# Patient Record
Sex: Female | Born: 1970 | Race: Black or African American | Hispanic: No | Marital: Single | Smoking: Former smoker
Health system: Southern US, Community
[De-identification: ages and names within clinical notes are randomized; demographics above are authoritative.]

## PROBLEM LIST (undated history)

## (undated) DIAGNOSIS — F419 Anxiety disorder, unspecified: Secondary | ICD-10-CM

## (undated) DIAGNOSIS — Z87891 Personal history of nicotine dependence: Secondary | ICD-10-CM

## (undated) DIAGNOSIS — J189 Pneumonia, unspecified organism: Secondary | ICD-10-CM

## (undated) DIAGNOSIS — Z9889 Other specified postprocedural states: Secondary | ICD-10-CM

## (undated) DIAGNOSIS — D649 Anemia, unspecified: Secondary | ICD-10-CM

## (undated) DIAGNOSIS — F32A Depression, unspecified: Secondary | ICD-10-CM

## (undated) DIAGNOSIS — C801 Malignant (primary) neoplasm, unspecified: Secondary | ICD-10-CM

## (undated) HISTORY — PX: THYROIDECTOMY: SHX17

## (undated) HISTORY — DX: Personal history of nicotine dependence: Z87.891

## (undated) HISTORY — PX: PARTIAL HYSTERECTOMY: SHX80

---

## 2005-03-26 HISTORY — PX: CAROTID ENDARTERECTOMY: SUR193

## 2021-02-15 ENCOUNTER — Other Ambulatory Visit: Payer: Self-pay

## 2021-02-15 ENCOUNTER — Ambulatory Visit: Payer: BC Managed Care – PPO | Admitting: Pulmonary Disease

## 2021-02-15 ENCOUNTER — Encounter: Payer: Self-pay | Admitting: Pulmonary Disease

## 2021-02-15 VITALS — BP 122/76 | HR 87 | Temp 98.1°F | Ht 63.0 in | Wt 182.8 lb

## 2021-02-15 DIAGNOSIS — R911 Solitary pulmonary nodule: Secondary | ICD-10-CM | POA: Diagnosis not present

## 2021-02-15 DIAGNOSIS — Z87891 Personal history of nicotine dependence: Secondary | ICD-10-CM | POA: Diagnosis not present

## 2021-02-15 DIAGNOSIS — J432 Centrilobular emphysema: Secondary | ICD-10-CM | POA: Diagnosis not present

## 2021-02-15 MED ORDER — SPIRIVA RESPIMAT 1.25 MCG/ACT IN AERS: 2.0000 | INHALATION_SPRAY | Freq: Every day | RESPIRATORY_TRACT | 0 refills | Status: AC

## 2021-02-15 MED ORDER — ALBUTEROL SULFATE HFA 108 (90 BASE) MCG/ACT IN AERS: 2.0000 | INHALATION_SPRAY | Freq: Four times a day (QID) | RESPIRATORY_TRACT | 6 refills | Status: AC | PRN

## 2021-02-15 NOTE — Progress Notes (Signed)
Synopsis: Referred in November for lung mass by No ref. provider found  Subjective:   PATIENT ID: Brittany Macias GENDER: female DOB: [birth year], MRN: [id number]  Chief Complaint  Patient presents with   Consult    Patient has a lung mass    PMH former smoker, presents today from [facility].  Patient had a lung cancer screening CT that was done on 02/13/2021.  She just quit smoking this past week.  She smokes since she was a early teen.  She does have some evidence of emphysema on her CT scan.  She has a new right lower lobe 2.3 cm nodule with spiculated margins.  Just distal to that there is a larger 3 cm consolidation that is triangular in shape representing possibly in a separate mass versus atelectasis.   Past Medical History:  Diagnosis Date   Former smoker      Family History  Problem Relation Age of Onset   Gastric cancer Mother    Lymphoma Sister      Past Surgical History:  Procedure Laterality Date   THYROIDECTOMY      Social History   Socioeconomic History   Marital status: Single    Spouse name: Not on file   Number of children: Not on file   Years of education: Not on file   Highest education level: Not on file  Occupational History   Not on file  Tobacco Use   Smoking status: Former    Types: Cigarettes   Smokeless tobacco: Never  Substance and Sexual Activity   Alcohol use: Not on file   Drug use: Not on file   Sexual activity: Not on file  Other Topics Concern   Not on file  Social History Narrative   Not on file   Social Determinants of Health   Financial Resource Strain: Not on file  Food Insecurity: Not on file  Transportation Needs: Not on file  Physical Activity: Not on file  Stress: Not on file  Social Connections: Not on file  Intimate Partner Violence: Not on file     Allergies  Allergen Reactions   Erythromycin Anaphylaxis   Sulfabenzamide Swelling     Outpatient Medications Prior to Visit  Medication Sig  Dispense Refill   levothyroxine (SYNTHROID) 100 MCG tablet Take 100 mcg by mouth daily.     No facility-administered medications prior to visit.    Review of Systems  Constitutional:  Negative for chills, fever, malaise/fatigue and weight loss.  HENT:  Negative for hearing loss, sore throat and tinnitus.   Eyes:  Negative for blurred vision and double vision.  Respiratory:  Positive for cough. Negative for hemoptysis, sputum production, shortness of breath, wheezing and stridor.   Cardiovascular:  Negative for chest pain, palpitations, orthopnea, leg swelling and PND.  Gastrointestinal:  Negative for abdominal pain, constipation, diarrhea, heartburn, nausea and vomiting.  Genitourinary:  Negative for dysuria, hematuria and urgency.  Musculoskeletal:  Negative for joint pain and myalgias.  Skin:  Negative for itching and rash.  Neurological:  Negative for dizziness, tingling, weakness and headaches.  Endo/Heme/Allergies:  Negative for environmental allergies. Does not bruise/bleed easily.  Psychiatric/Behavioral:  Negative for depression. The patient is not nervous/anxious and does not have insomnia.   All other systems reviewed and are negative.   Objective:  Physical Exam Vitals reviewed.  Constitutional:      General: She is not in acute distress.    Appearance: She is well-developed.  HENT:  Head: Normocephalic and atraumatic.  Eyes:     General: No scleral icterus.    Conjunctiva/sclera: Conjunctivae normal.     Pupils: Pupils are equal, round, and reactive to light.  Neck:     Vascular: No JVD.     Trachea: No tracheal deviation.  Cardiovascular:     Rate and Rhythm: Normal rate and regular rhythm.     Heart sounds: Normal heart sounds. No murmur heard. Pulmonary:     Effort: Pulmonary effort is normal. No tachypnea, accessory muscle usage or respiratory distress.     Breath sounds: Normal breath sounds. No stridor. No wheezing, rhonchi or rales.  Abdominal:      General: Bowel sounds are normal. There is no distension.     Palpations: Abdomen is soft.     Tenderness: There is no abdominal tenderness.  Musculoskeletal:        General: No tenderness.     Cervical back: Neck supple.  Lymphadenopathy:     Cervical: No cervical adenopathy.  Skin:    General: Skin is warm and dry.     Capillary Refill: Capillary refill takes less than 2 seconds.     Findings: No rash.  Neurological:     Mental Status: She is alert and oriented to person, place, and time.  Psychiatric:        Behavior: Behavior normal.     Vitals:   02/15/21 0859  BP: 122/76  Pulse: 87  Temp: 98.1 F (36.7 C)  TempSrc: Oral  SpO2: 99%  Weight: 182 lb 12.8 oz (82.9 kg)  Height: 5\' 3"  (1.6 m)   99% on RA BMI Readings from Last 3 Encounters:  02/15/21 32.38 kg/m   Wt Readings from Last 3 Encounters:  02/15/21 182 lb 12.8 oz (82.9 kg)     CBC No results found for: WBC, RBC, HGB, HCT, PLT, MCV, MCH, MCHC, RDW, LYMPHSABS, MONOABS, EOSABS, BASOSABS    Chest Imaging: CT imaging November 2022 lung cancer screening: This was completed at [facility] health.  Disc was brought to the office today which we reviewed. 2.3 cm right lower lobe spiculated mass with an associated more distal pleural-based 3 cm lesion which looks more like atelectasis. The patient's images have been independently reviewed by me.    Pulmonary Functions Testing Results: No flowsheet data found.  FeNO:   Pathology:   Echocardiogram:   Heart Catheterization:     Assessment & Plan:     ICD-10-CM   1. Lung nodule  R91.1 NM PET Image Initial (PI) Skull Base To Thigh (F-18 FDG)    Pulmonary Function Test    2. Centrilobular emphysema ([place])  J43.2     3. Former smoker  Z87.891       Discussion:  This is a 50 year old female, right lower lobe pulmonary nodule, former smoker, abnormal lung cancer screening CT referred for evaluation of lung nodule.  Plan: Patient has a lesion which is  concerning for malignancy. I think next best step is to evaluate both of the lesions present with a nuclear medicine PET scan. Pending upon the PET if we need to proceed with biopsy versus consideration for direct resection. We will get PFTs as well in this process. Patient return to see [place] in approximately 3 weeks after all of these tests are complete. We will come up with next best steps. I am hopeful that she has stage I disease and will be able to consider resection.    Current Outpatient Medications:  albuterol (VENTOLIN HFA) 108 (90 Base) MCG/ACT inhaler, Inhale 2 puffs into the lungs every 6 (six) hours as needed for wheezing or shortness of breath., Disp: 8 g, Rfl: 6   levothyroxine (SYNTHROID) 100 MCG tablet, Take 100 mcg by mouth daily., Disp: , Rfl:   I spent 62 minutes dedicated to the care of this patient on the date of this encounter to include pre-visit review of records, face-to-face time with the patient discussing conditions above, post visit ordering of testing, clinical documentation with the electronic health record, making appropriate referrals as documented, and communicating necessary findings to members of the patients care team.   Garner Nash, DO [facility] 02/15/2021 9:35 AM

## 2021-02-15 NOTE — Patient Instructions (Addendum)
Thank you for visiting Dr. Valeta Harms at [facility]. Today we recommend the following:  Orders Placed This Encounter  Procedures   NM PET Image Initial (PI) Skull Base To Thigh (F-18 FDG)   Pulmonary Function Test   Please see me after the PET scan and PFTs are complete.   Return in about 3 weeks (around 03/08/2021) for w/Dr. Valeta Harms .    Please do your part to reduce the spread of COVID-19.

## 2021-02-23 ENCOUNTER — Encounter (HOSPITAL_COMMUNITY)
Admission: RE | Admit: 2021-02-23 | Discharge: 2021-02-23 | Disposition: A | Discharge status: Home / Self Care | Payer: BC Managed Care – PPO | Source: Ambulatory Visit | Attending: Pulmonary Disease | Admitting: Pulmonary Disease

## 2021-02-23 ENCOUNTER — Ambulatory Visit (HOSPITAL_COMMUNITY)
Admission: RE | Admit: 2021-02-23 | Discharge: 2021-02-23 | Disposition: A | Discharge status: Home / Self Care | Payer: BC Managed Care – PPO | Source: Ambulatory Visit | Attending: Pulmonary Disease | Admitting: Pulmonary Disease

## 2021-02-23 ENCOUNTER — Other Ambulatory Visit: Payer: Self-pay

## 2021-02-23 DIAGNOSIS — R911 Solitary pulmonary nodule: Secondary | ICD-10-CM | POA: Insufficient documentation

## 2021-02-23 LAB — PULMONARY FUNCTION TEST
DL/VA % pred: 102 %
DL/VA: 4.41 ml/min/mmHg/L
DLCO unc % pred: 67 %
DLCO unc: 13.72 ml/min/mmHg
FEF 25-75 Post: 3 L/sec
FEF 25-75 Pre: 2.96 L/sec
FEF2575-%Change-Post: 1 %
FEF2575-%Pred-Post: 129 %
FEF2575-%Pred-Pre: 128 %
FEV1-%Change-Post: 0 %
FEV1-%Pred-Post: 122 %
FEV1-%Pred-Pre: 122 %
FEV1-Post: 2.69 L
FEV1-Pre: 2.68 L
FEV1FVC-%Change-Post: 2 %
FEV1FVC-%Pred-Pre: 101 %
FEV6-%Change-Post: -1 %
FEV6-%Pred-Post: 120 %
FEV6-%Pred-Pre: 122 %
FEV6-Post: 3.21 L
FEV6-Pre: 3.26 L
FEV6FVC-%Change-Post: 0 %
FEV6FVC-%Pred-Post: 103 %
FEV6FVC-%Pred-Pre: 103 %
FVC-%Change-Post: -1 %
FVC-%Pred-Post: 116 %
FVC-%Pred-Pre: 118 %
FVC-Post: 3.21 L
FVC-Pre: 3.26 L
Post FEV1/FVC ratio: 84 %
Post FEV6/FVC ratio: 100 %
Pre FEV1/FVC ratio: 82 %
Pre FEV6/FVC Ratio: 100 %
RV % pred: 102 %
RV: 1.83 L
TLC % pred: 107 %
TLC: 5.28 L

## 2021-02-23 MED ORDER — FLUDEOXYGLUCOSE F - 18 (FDG) INJECTION
9.0350 | Freq: Once | INTRAVENOUS | Status: AC | PRN
  Administered 2021-02-23: 9.035 via INTRAVENOUS

## 2021-02-23 MED ORDER — ALBUTEROL SULFATE (2.5 MG/3ML) 0.083% IN NEBU
2.5000 mg | INHALATION_SOLUTION | Freq: Once | RESPIRATORY_TRACT | Status: AC
  Administered 2021-02-23: 2.5 mg via RESPIRATORY_TRACT

## 2021-02-27 ENCOUNTER — Ambulatory Visit: Payer: BC Managed Care – PPO | Admitting: Pulmonary Disease

## 2021-02-27 ENCOUNTER — Encounter: Payer: Self-pay | Admitting: Pulmonary Disease

## 2021-02-27 ENCOUNTER — Other Ambulatory Visit: Payer: Self-pay

## 2021-02-27 VITALS — BP 136/88 | HR 88 | Temp 98.2°F | Ht 63.0 in | Wt 181.2 lb

## 2021-02-27 DIAGNOSIS — R911 Solitary pulmonary nodule: Secondary | ICD-10-CM

## 2021-02-27 DIAGNOSIS — Z87891 Personal history of nicotine dependence: Secondary | ICD-10-CM | POA: Diagnosis not present

## 2021-02-27 DIAGNOSIS — R942 Abnormal results of pulmonary function studies: Secondary | ICD-10-CM | POA: Diagnosis not present

## 2021-02-27 NOTE — Patient Instructions (Signed)
Thank you for visiting Dr. Valeta Harms at [facility]. Today we recommend the following:  Orders Placed This Encounter  Procedures   Ambulatory referral to Cardiothoracic Surgery   Appt pending with Dr. Kipp Brood   Return in about 3 months (around 05/28/2021) for w/ Dr. Valeta Harms .    Please do your part to reduce the spread of COVID-19.

## 2021-02-27 NOTE — Progress Notes (Signed)
Synopsis: Referred in November for lung mass by Marjo Bicker, MD  Subjective:   PATIENT ID: Brittany Macias GENDER: female DOB: Aug 14, 1970, MRN: [id number]  Chief Complaint  Patient presents with   Follow-up    Follow up on PFT and PET scan results    PMH former smoker, presents today from [facility].  Patient had a lung cancer screening CT that was done on 02/13/2021.  She just quit smoking this past week.  She smokes since she was a early teen.  She does have some evidence of emphysema on her CT scan.  She has a new right lower lobe 2.3 cm nodule with spiculated margins.  Just distal to that there is a larger 3 cm consolidation that is triangular in shape representing possibly in a separate mass versus atelectasis.  OV 02/27/2021: Here today for follow-up after PET scan imaging. Patient had PET scan completed on 02/23/2021.  PET scan reveals a hypermetabolic centralized lesion at 1.3 x 1.2 cm with an SUV max of 6.6.  There is a more distal pleural-based lesion with low-level metabolism.  Patient also had pulmonary function test completed.  Today in the office we reviewed PFTs and pulmonary function test and talked about next steps.   Past Medical History:  Diagnosis Date   Former smoker      Family History  Problem Relation Age of Onset   Gastric cancer Mother    Lymphoma Sister      Past Surgical History:  Procedure Laterality Date   THYROIDECTOMY      Social History   Socioeconomic History   Marital status: Single    Spouse name: Not on file   Number of children: Not on file   Years of education: Not on file   Highest education level: Not on file  Occupational History   Not on file  Tobacco Use   Smoking status: Former    Types: Cigarettes   Smokeless tobacco: Never  Substance and Sexual Activity   Alcohol use: Not on file   Drug use: Not on file   Sexual activity: Not on file  Other Topics Concern   Not on file  Social History Narrative   Not  on file   Social Determinants of Health   Financial Resource Strain: Not on file  Food Insecurity: Not on file  Transportation Needs: Not on file  Physical Activity: Not on file  Stress: Not on file  Social Connections: Not on file  Intimate Partner Violence: Not on file     Allergies  Allergen Reactions   Erythromycin Anaphylaxis   Sulfabenzamide Swelling     Outpatient Medications Prior to Visit  Medication Sig Dispense Refill   albuterol (VENTOLIN HFA) 108 (90 Base) MCG/ACT inhaler Inhale 2 puffs into the lungs every 6 (six) hours as needed for wheezing or shortness of breath. 8 g 6   levothyroxine (SYNTHROID) 100 MCG tablet Take 100 mcg by mouth daily.     Tiotropium Bromide Monohydrate (SPIRIVA RESPIMAT) 1.25 MCG/ACT AERS Inhale 2 puffs into the lungs daily. 4 g 0   No facility-administered medications prior to visit.    Review of Systems  Constitutional:  Negative for chills, fever, malaise/fatigue and weight loss.  HENT:  Negative for hearing loss, sore throat and tinnitus.   Eyes:  Negative for blurred vision and double vision.  Respiratory:  Negative for cough, hemoptysis, sputum production, shortness of breath, wheezing and stridor.   Cardiovascular:  Negative for chest pain, palpitations,  orthopnea, leg swelling and PND.  Gastrointestinal:  Negative for abdominal pain, constipation, diarrhea, heartburn, nausea and vomiting.  Genitourinary:  Negative for dysuria, hematuria and urgency.  Musculoskeletal:  Negative for joint pain and myalgias.  Skin:  Negative for itching and rash.  Neurological:  Negative for dizziness, tingling, weakness and headaches.  Endo/Heme/Allergies:  Negative for environmental allergies. Does not bruise/bleed easily.  Psychiatric/Behavioral:  Negative for depression. The patient is not nervous/anxious and does not have insomnia.   All other systems reviewed and are negative.   Objective:  Physical Exam Vitals reviewed.  Constitutional:       General: She is not in acute distress.    Appearance: She is well-developed.  HENT:     Head: Normocephalic and atraumatic.  Eyes:     General: No scleral icterus.    Conjunctiva/sclera: Conjunctivae normal.     Pupils: Pupils are equal, round, and reactive to light.  Neck:     Vascular: No JVD.     Trachea: No tracheal deviation.  Cardiovascular:     Rate and Rhythm: Normal rate and regular rhythm.     Heart sounds: Normal heart sounds. No murmur heard. Pulmonary:     Effort: Pulmonary effort is normal. No tachypnea, accessory muscle usage or respiratory distress.     Breath sounds: Normal breath sounds. No stridor. No wheezing, rhonchi or rales.  Abdominal:     General: Bowel sounds are normal. There is no distension.     Palpations: Abdomen is soft.     Tenderness: There is no abdominal tenderness.  Musculoskeletal:        General: No tenderness.     Cervical back: Neck supple.  Lymphadenopathy:     Cervical: No cervical adenopathy.  Skin:    General: Skin is warm and dry.     Capillary Refill: Capillary refill takes less than 2 seconds.     Findings: No rash.  Neurological:     Mental Status: She is alert and oriented to person, place, and time.  Psychiatric:        Behavior: Behavior normal.     Vitals:   02/27/21 0917  BP: 136/88  Pulse: 88  Temp: 98.2 F (36.8 C)  TempSrc: Oral  SpO2: 99%  Weight: 181 lb 3.2 oz (82.2 kg)  Height: 5\' 3"  (1.6 m)   99% on RA BMI Readings from Last 3 Encounters:  02/27/21 32.10 kg/m  02/15/21 32.38 kg/m   Wt Readings from Last 3 Encounters:  02/27/21 181 lb 3.2 oz (82.2 kg)  02/15/21 182 lb 12.8 oz (82.9 kg)     CBC No results found for: WBC, RBC, HGB, HCT, PLT, MCV, MCH, MCHC, RDW, LYMPHSABS, MONOABS, EOSABS, BASOSABS    Chest Imaging: CT imaging November 2022 lung cancer screening: This was completed at [facility] health.  Disc was brought to the office today which we reviewed. 2.3 cm right lower lobe  spiculated mass with an associated more distal pleural-based 3 cm lesion which looks more like atelectasis. The patient's images have been independently reviewed by me.    Nuclear medicine pet imaging 02/23/2021: Patient has a small focus 1.3 x 1.1 cm in the proximal portion of the right lower lobe centrally located hypermetabolic uptake with an SUV max of 6.6 concerning for primary bronchogenic carcinoma.   Pulmonary Functions Testing Results: PFT Results Latest Ref Rng & Units 02/23/2021  FVC-Pre L 3.26  FVC-Predicted Pre % 118  FVC-Post L 3.21  FVC-Predicted Post % 116  Pre FEV1/FVC % % 82  Post FEV1/FCV % % 84  FEV1-Pre L 2.68  FEV1-Predicted Pre % 122  FEV1-Post L 2.69  DLCO uncorrected ml/min/mmHg 13.72  DLCO UNC% % 67  DLVA Predicted % 102  TLC L 5.28  TLC % Predicted % 107  RV % Predicted % 102    FeNO:   Pathology:   Echocardiogram:   Heart Catheterization:     Assessment & Plan:     ICD-10-CM   1. Lung nodule  R91.1 Ambulatory referral to Cardiothoracic Surgery    2. Abnormal PET of right lung  R94.2     3. Former smoker  Z87.891        Discussion:  This is a 50 year old female, right lower lobe pulmonary nodule, former smoker, abnormal lung cancer screening CT, now with an abnormal PET scan with a centrally located 1.3 x 1.2 cm lesion.  She has what appears to me as a more centrally located nodule that has postobstructive changes afterwards that is causing atelectasis.  Plan: She is young with good pulmonary function test.  We reviewed this today in the office. We will send a referral to thoracic surgery for consideration of combination case versus resection. I think patient would be a good candidate for this as she is also a recent non-smoker. She is overall anxious regarding this and I explained that we would come up with a plan and determine next steps. If Dr. Kipp Brood thinks that we need to biopsy this we could always consider doing it first  however we can also plan potentially for combination single anesthetic event.  We appreciate the coordination of care.   Current Outpatient Medications:    albuterol (VENTOLIN HFA) 108 (90 Base) MCG/ACT inhaler, Inhale 2 puffs into the lungs every 6 (six) hours as needed for wheezing or shortness of breath., Disp: 8 g, Rfl: 6   levothyroxine (SYNTHROID) 100 MCG tablet, Take 100 mcg by mouth daily., Disp: , Rfl:    Tiotropium Bromide Monohydrate (SPIRIVA RESPIMAT) 1.25 MCG/ACT AERS, Inhale 2 puffs into the lungs daily., Disp: 4 g, Rfl: 0   Garner Nash, DO [facility] 02/27/2021 9:40 AM

## 2021-02-27 NOTE — H&P (View-Only) (Signed)
Synopsis: Referred in November for lung mass by Marjo Bicker, MD  Subjective:   PATIENT ID: Brittany Macias GENDER: female DOB: [birth year], MRN: [id number]  Chief Complaint  Patient presents with   Follow-up    Follow up on PFT and PET scan results    PMH former smoker, presents today from [facility].  Patient had a lung cancer screening CT that was done on 02/13/2021.  She just quit smoking this past week.  She smokes since she was a early teen.  She does have some evidence of emphysema on her CT scan.  She has a new right lower lobe 2.3 cm nodule with spiculated margins.  Just distal to that there is a larger 3 cm consolidation that is triangular in shape representing possibly in a separate mass versus atelectasis.  OV 02/27/2021: Here today for follow-up after PET scan imaging. Patient had PET scan completed on 02/23/2021.  PET scan reveals a hypermetabolic centralized lesion at 1.3 x 1.2 cm with an SUV max of 6.6.  There is a more distal pleural-based lesion with low-level metabolism.  Patient also had pulmonary function test completed.  Today in the office we reviewed PFTs and pulmonary function test and talked about next steps.   Past Medical History:  Diagnosis Date   Former smoker      Family History  Problem Relation Age of Onset   Gastric cancer Mother    Lymphoma Sister      Past Surgical History:  Procedure Laterality Date   THYROIDECTOMY      Social History   Socioeconomic History   Marital status: Single    Spouse name: Not on file   Number of children: Not on file   Years of education: Not on file   Highest education level: Not on file  Occupational History   Not on file  Tobacco Use   Smoking status: Former    Types: Cigarettes   Smokeless tobacco: Never  Substance and Sexual Activity   Alcohol use: Not on file   Drug use: Not on file   Sexual activity: Not on file  Other Topics Concern   Not on file  Social History Narrative   Not  on file   Social Determinants of Health   Financial Resource Strain: Not on file  Food Insecurity: Not on file  Transportation Needs: Not on file  Physical Activity: Not on file  Stress: Not on file  Social Connections: Not on file  Intimate Partner Violence: Not on file     Allergies  Allergen Reactions   Erythromycin Anaphylaxis   Sulfabenzamide Swelling     Outpatient Medications Prior to Visit  Medication Sig Dispense Refill   albuterol (VENTOLIN HFA) 108 (90 Base) MCG/ACT inhaler Inhale 2 puffs into the lungs every 6 (six) hours as needed for wheezing or shortness of breath. 8 g 6   levothyroxine (SYNTHROID) 100 MCG tablet Take 100 mcg by mouth daily.     Tiotropium Bromide Monohydrate (SPIRIVA RESPIMAT) 1.25 MCG/ACT AERS Inhale 2 puffs into the lungs daily. 4 g 0   No facility-administered medications prior to visit.    Review of Systems  Constitutional:  Negative for chills, fever, malaise/fatigue and weight loss.  HENT:  Negative for hearing loss, sore throat and tinnitus.   Eyes:  Negative for blurred vision and double vision.  Respiratory:  Negative for cough, hemoptysis, sputum production, shortness of breath, wheezing and stridor.   Cardiovascular:  Negative for chest pain, palpitations,  orthopnea, leg swelling and PND.  Gastrointestinal:  Negative for abdominal pain, constipation, diarrhea, heartburn, nausea and vomiting.  Genitourinary:  Negative for dysuria, hematuria and urgency.  Musculoskeletal:  Negative for joint pain and myalgias.  Skin:  Negative for itching and rash.  Neurological:  Negative for dizziness, tingling, weakness and headaches.  Endo/Heme/Allergies:  Negative for environmental allergies. Does not bruise/bleed easily.  Psychiatric/Behavioral:  Negative for depression. The patient is not nervous/anxious and does not have insomnia.   All other systems reviewed and are negative.   Objective:  Physical Exam Vitals reviewed.  Constitutional:       General: She is not in acute distress.    Appearance: She is well-developed.  HENT:     Head: Normocephalic and atraumatic.  Eyes:     General: No scleral icterus.    Conjunctiva/sclera: Conjunctivae normal.     Pupils: Pupils are equal, round, and reactive to light.  Neck:     Vascular: No JVD.     Trachea: No tracheal deviation.  Cardiovascular:     Rate and Rhythm: Normal rate and regular rhythm.     Heart sounds: Normal heart sounds. No murmur heard. Pulmonary:     Effort: Pulmonary effort is normal. No tachypnea, accessory muscle usage or respiratory distress.     Breath sounds: Normal breath sounds. No stridor. No wheezing, rhonchi or rales.  Abdominal:     General: Bowel sounds are normal. There is no distension.     Palpations: Abdomen is soft.     Tenderness: There is no abdominal tenderness.  Musculoskeletal:        General: No tenderness.     Cervical back: Neck supple.  Lymphadenopathy:     Cervical: No cervical adenopathy.  Skin:    General: Skin is warm and dry.     Capillary Refill: Capillary refill takes less than 2 seconds.     Findings: No rash.  Neurological:     Mental Status: She is alert and oriented to person, place, and time.  Psychiatric:        Behavior: Behavior normal.     Vitals:   02/27/21 0917  BP: 136/88  Pulse: 88  Temp: 98.2 F (36.8 C)  TempSrc: Oral  SpO2: 99%  Weight: 181 lb 3.2 oz (82.2 kg)  Height: 5\' 3"  (1.6 m)   99% on RA BMI Readings from Last 3 Encounters:  02/27/21 32.10 kg/m  02/15/21 32.38 kg/m   Wt Readings from Last 3 Encounters:  02/27/21 181 lb 3.2 oz (82.2 kg)  02/15/21 182 lb 12.8 oz (82.9 kg)     CBC No results found for: WBC, RBC, HGB, HCT, PLT, MCV, MCH, MCHC, RDW, LYMPHSABS, MONOABS, EOSABS, BASOSABS    Chest Imaging: CT imaging November 2022 lung cancer screening: This was completed at [facility].  Disc was brought to the office today which we reviewed. 2.3 cm right lower lobe  spiculated mass with an associated more distal pleural-based 3 cm lesion which looks more like atelectasis. The patient's images have been independently reviewed by me.    Nuclear medicine pet imaging 02/23/2021: Patient has a small focus 1.3 x 1.1 cm in the proximal portion of the right lower lobe centrally located hypermetabolic uptake with an SUV max of 6.6 concerning for primary bronchogenic carcinoma.   Pulmonary Functions Testing Results: PFT Results Latest Ref Rng & Units 02/23/2021  FVC-Pre L 3.26  FVC-Predicted Pre % 118  FVC-Post L 3.21  FVC-Predicted Post % 116  Pre FEV1/FVC % % 82  Post FEV1/FCV % % 84  FEV1-Pre L 2.68  FEV1-Predicted Pre % 122  FEV1-Post L 2.69  DLCO uncorrected ml/min/mmHg 13.72  DLCO UNC% % 67  DLVA Predicted % 102  TLC L 5.28  TLC % Predicted % 107  RV % Predicted % 102    FeNO:   Pathology:   Echocardiogram:   Heart Catheterization:     Assessment & Plan:     ICD-10-CM   1. Lung nodule  R91.1 Ambulatory referral to Cardiothoracic Surgery    2. Abnormal PET of right lung  R94.2     3. Former smoker  Z87.891        Discussion:  This is a 50 year old female, right lower lobe pulmonary nodule, former smoker, abnormal lung cancer screening CT, now with an abnormal PET scan with a centrally located 1.3 x 1.2 cm lesion.  She has what appears to me as a more centrally located nodule that has postobstructive changes afterwards that is causing atelectasis.  Plan: She is young with good pulmonary function test.  We reviewed this today in the office. We will send a referral to thoracic surgery for consideration of combination case versus resection. I think patient would be a good candidate for this as she is also a recent non-smoker. She is overall anxious regarding this and I explained that we would come up with a plan and determine next steps. If Dr. Kipp Brood thinks that we need to biopsy this we could always consider doing it first  however we can also plan potentially for combination single anesthetic event.  We appreciate the coordination of care.   Current Outpatient Medications:    albuterol (VENTOLIN HFA) 108 (90 Base) MCG/ACT inhaler, Inhale 2 puffs into the lungs every 6 (six) hours as needed for wheezing or shortness of breath., Disp: 8 g, Rfl: 6   levothyroxine (SYNTHROID) 100 MCG tablet, Take 100 mcg by mouth daily., Disp: , Rfl:    Tiotropium Bromide Monohydrate (SPIRIVA RESPIMAT) 1.25 MCG/ACT AERS, Inhale 2 puffs into the lungs daily., Disp: 4 g, Rfl: 0   Garner Nash, DO [facility] 02/27/2021 9:40 AM

## 2021-03-02 ENCOUNTER — Encounter (HOSPITAL_COMMUNITY): Payer: BC Managed Care – PPO

## 2021-03-06 NOTE — Progress Notes (Deleted)
[address]       [place] [address]             [phone]                    Brittany Macias [place] Medical Record #[id number] Date of Birth: 1970/12/27  Referring: Marjo Bicker, MD Primary Care: Marjo Bicker, MD Primary Cardiologist: None  Chief Complaint:   No chief complaint on file.   History of Present Illness:    Brittany Macias 50 y.o. female ***    Patient had a lung cancer screening CT that was done on 02/13/2021.  She just quit smoking this past week.  She smokes since she was a early teen.  She does have some evidence of emphysema on her CT scan.  She has a new right lower lobe 1.3 cm nodule with spiculated margins.  Just distal to that there is a larger 3 cm consolidation that is triangular in shape representing possibly in a separate mass versus atelectasis.  PET scan reveals a hypermetabolic centralized lesion at 1.3 x 1.2 cm with an SUV max of 6.6.  There is a more distal pleural-based lesion with low-level metabolism.  Smoking Hx: Recently quit smoking.     Zubrod Score: At the time of surgery this patient's most appropriate activity status/level should be described as: []     0    Normal activity, no symptoms []     1    Restricted in physical strenuous activity but ambulatory, able to do out light work []     2    Ambulatory and capable of self care, unable to do work activities, up and about               >50 % of waking hours                              []     3    Only limited self care, in bed greater than 50% of waking hours []     4    Completely disabled, no self care, confined to bed or chair []     5    Moribund   Past Medical History:  Diagnosis Date   Former smoker     Past Surgical History:  Procedure Laterality Date   THYROIDECTOMY      Family History  Problem Relation Age of Onset   Gastric cancer Mother    Lymphoma Sister      Social History   Tobacco Use  Smoking Status Former   Types: Cigarettes   Smokeless Tobacco Never    Social History   Substance and Sexual Activity  Alcohol Use None     Allergies  Allergen Reactions   Erythromycin Anaphylaxis   Sulfabenzamide Swelling    Current Outpatient Medications  Medication Sig Dispense Refill   albuterol (VENTOLIN HFA) 108 (90 Base) MCG/ACT inhaler Inhale 2 puffs into the lungs every 6 (six) hours as needed for wheezing or shortness of breath. 8 g 6   levothyroxine (SYNTHROID) 100 MCG tablet Take 100 mcg by mouth daily.     Tiotropium Bromide Monohydrate (SPIRIVA RESPIMAT) 1.25 MCG/ACT AERS Inhale 2 puffs into the lungs daily. 4 g 0   No current facility-administered medications for this visit.    ROS   PHYSICAL EXAMINATION: There were no vitals taken for this visit. Physical Exam  Diagnostic  Studies & Laboratory data:     Recent Radiology Findings:   NM PET Image Initial (PI) Skull Base To Thigh (F-18 FDG)  Result Date: 02/24/2021 CLINICAL DATA:  Initial treatment strategy for lung nodule. EXAM: NUCLEAR MEDICINE PET SKULL BASE TO THIGH TECHNIQUE: 9.0 mCi F-18 FDG was injected intravenously. Full-ring PET imaging was performed from the skull base to thigh after the radiotracer. CT data was obtained and used for attenuation correction and anatomic localization. Fasting blood glucose: 93 mg/dl COMPARISON:  None. FINDINGS: Mediastinal blood pool activity: SUV max 2.9 Liver activity: SUV max 3.5 NECK: No hypermetabolic lymph nodes in the neck.Symmetric paracervical hypermetabolic activity with no associated soft tissue abnormality, likely due to brown fat. Incidental CT findings: None. CHEST: Linear opacity of the right lower lobe with central hypermetabolic activity measuring approximately 1.3 x 1.1 cm, SUV max of 6.6. more distal pleural lesion demonstrates low level FDG uptake which is slightly above mediastinal blood pool, 3.2. Symmetric hypermetabolic activity of the bilateral neck bases and paracervical soft tissues with no  associated soft tissue abnormality, findings are favored to be due to brown fat. No hypermetabolic lymph nodes seen in the chest. Incidental CT findings: Upper lobe predominant paraseptal emphysema. Mild bilateral air trapping. Mild calcified plaque of the thoracic aorta. ABDOMEN/PELVIS: No abnormal hypermetabolic activity within the liver, pancreas, adrenal glands, or spleen. No hypermetabolic lymph nodes in the abdomen or pelvis. Incidental CT findings: Atherosclerotic disease of the abdominal aorta. SKELETON: No focal hypermetabolic activity to suggest skeletal metastasis. Incidental CT findings: none IMPRESSION: 1. Linear opacity of the right lower lobe with central hypermetabolic activity, findings are concerning for primary lung malignancy with associated postobstructive changes. 2. No evidence of FDG avid metastatic disease in the chest, abdomen or pelvis. 3. Bilateral neck base and paraspinal FDG uptake with no associated soft tissue abnormality, likely due to brown fat. Electronically Signed   By: Yetta Glassman M.D.   On: 02/24/2021 16:14       I have independently reviewed the above radiology studies  and reviewed the findings with the patient.   Recent Lab Findings: No results found for: WBC, HGB, HCT, PLT, GLUCOSE, CHOL, TRIG, HDL, LDLDIRECT, LDLCALC, ALT, AST, NA, K, CL, CREATININE, BUN, CO2, TSH, INR, GLUF, HGBA1C   PFTs:  - FVC: 118% - FEV1: 122% -DLCO: 67%   Problem List: 1.3 cm RLL pulm nodule with SUV 6.6       Assessment / Plan:   50 yo female with RLL 1.3cm pulmonary nodule concerning for primary lung cancer.  Combo case with Icard.  Would not be a candidate for a wedge resection given its location and surrounding atelectasis.  If biopsy is negative, then will need to undergo lobectomy for diagnosis.  High risk of lung cancer.  Good PFTs     I  spent {CHL ONC TIME VISIT - ZOXWR:[id number]} with  the patient face to face in counseling and coordination of care.     Lajuana Matte 03/06/2021 7:08 AM

## 2021-03-07 ENCOUNTER — Encounter: Payer: BC Managed Care – PPO | Admitting: Thoracic Surgery (Cardiothoracic Vascular Surgery)

## 2021-03-08 ENCOUNTER — Institutional Professional Consult (permissible substitution) (INDEPENDENT_AMBULATORY_CARE_PROVIDER_SITE_OTHER): Payer: BC Managed Care – PPO | Admitting: Thoracic Surgery (Cardiothoracic Vascular Surgery)

## 2021-03-08 ENCOUNTER — Other Ambulatory Visit: Payer: Self-pay

## 2021-03-08 ENCOUNTER — Other Ambulatory Visit: Payer: Self-pay | Admitting: Thoracic Surgery (Cardiothoracic Vascular Surgery)

## 2021-03-08 ENCOUNTER — Encounter: Payer: Self-pay | Admitting: Thoracic Surgery (Cardiothoracic Vascular Surgery)

## 2021-03-08 VITALS — BP 138/90 | HR 86 | Resp 20 | Ht 63.0 in | Wt 180.0 lb

## 2021-03-08 DIAGNOSIS — R911 Solitary pulmonary nodule: Secondary | ICD-10-CM

## 2021-03-08 NOTE — H&P (View-Only) (Signed)
[address]       [place] [address]             [phone]                    Courtnay Urbanowicz [place] Medical Record #[id number] Date of Birth: 1970/08/17  Referring: Garner Nash, DO Primary Care: Marjo Bicker, MD Primary Cardiologist: None  Chief Complaint:    Chief Complaint  Patient presents with   Lung Lesion    Surgical consult, PET Scan 02/23/21, PFT's 02/23/21     History of Present Illness:    Brittany Macias 50 y.o. female referred by Dr. Valeta Harms for surgical evaluation of a right lower lobe 1.3 cm pulmonary nodule.  This was originally found on lung cancer screening.  She recently quit smoking the last several weeks but continues to vape..  On the cross-sectional imaging in addition to the nodule she also has an area of consolidation.  PET/CT was performed which showed an SUV uptake of 6.6.       Zubrod Score: At the time of surgery this patients most appropriate activity status/level should be described as: [x]     0    Normal activity, no symptoms []     1    Restricted in physical strenuous activity but ambulatory, able to do out light work []     2    Ambulatory and capable of self care, unable to do work activities, up and about               >50 % of waking hours                              []     3    Only limited self care, in bed greater than 50% of waking hours []     4    Completely disabled, no self care, confined to bed or chair []     5    Moribund   Past Medical History:  Diagnosis Date   Former smoker     Past Surgical History:  Procedure Laterality Date   THYROIDECTOMY      Family History  Problem Relation Age of Onset   Gastric cancer Mother    Lymphoma Sister      Social History   Tobacco Use  Smoking Status Former   Types: Cigarettes  Smokeless Tobacco Never    Social History   Substance and Sexual Activity  Alcohol Use None     Allergies  Allergen Reactions   Erythromycin Anaphylaxis    Azithromycin Other (See Comments)    Upset stomach Upset stomach    Hydrocod Polst-Cpm Polst Er Other (See Comments)   Sulfabenzamide Swelling    Current Outpatient Medications  Medication Sig Dispense Refill   albuterol (VENTOLIN HFA) 108 (90 Base) MCG/ACT inhaler Inhale 2 puffs into the lungs every 6 (six) hours as needed for wheezing or shortness of breath. 8 g 6   ALPRAZolam (XANAX) 0.5 MG tablet Take 0.5 mg by mouth at bedtime as needed for anxiety.     FLUoxetine (PROZAC) 10 MG tablet Take 10 mg by mouth daily.     levothyroxine (SYNTHROID) 100 MCG tablet Take 100 mcg by mouth daily.     predniSONE (DELTASONE) 5 MG tablet Take 5 mg by mouth daily with breakfast.     Tiotropium Bromide Monohydrate (SPIRIVA RESPIMAT) 1.25  MCG/ACT AERS Inhale 2 puffs into the lungs daily. 4 g 0   No current facility-administered medications for this visit.    Review of Systems  Constitutional:  Negative for fever and malaise/fatigue.  Respiratory:  Negative for cough and shortness of breath.   Cardiovascular:  Negative for chest pain.  Musculoskeletal:  Positive for back pain and myalgias.  Neurological: Negative.   Psychiatric/Behavioral:  The patient is nervous/anxious.     PHYSICAL EXAMINATION: BP 138/90    Pulse 86    Resp 20    Ht 5\' 3"  (1.6 m)    Wt 180 lb (81.6 kg)    SpO2 96% Comment: RA   BMI 31.89 kg/m  Physical Exam Constitutional:      General: She is not in acute distress.    Appearance: Normal appearance. She is normal weight. She is not ill-appearing.  HENT:     Head: Normocephalic and atraumatic.  Eyes:     Extraocular Movements: Extraocular movements intact.  Cardiovascular:     Rate and Rhythm: Normal rate.  Pulmonary:     Effort: Pulmonary effort is normal. No respiratory distress.  Abdominal:     General: Abdomen is flat. There is no distension.  Musculoskeletal:        General: Normal range of motion.     Cervical back: Normal range of motion.  Skin:     General: Skin is warm and dry.  Neurological:     General: No focal deficit present.     Mental Status: She is alert and oriented to person, place, and time.    Diagnostic Studies & Laboratory data:     Recent Radiology Findings:   NM PET Image Initial (PI) Skull Base To Thigh (F-18 FDG)  Result Date: 02/24/2021 CLINICAL DATA:  Initial treatment strategy for lung nodule. EXAM: NUCLEAR MEDICINE PET SKULL BASE TO THIGH TECHNIQUE: 9.0 mCi F-18 FDG was injected intravenously. Full-ring PET imaging was performed from the skull base to thigh after the radiotracer. CT data was obtained and used for attenuation correction and anatomic localization. Fasting blood glucose: 93 mg/dl COMPARISON:  None. FINDINGS: Mediastinal blood pool activity: SUV max 2.9 Liver activity: SUV max 3.5 NECK: No hypermetabolic lymph nodes in the neck.Symmetric paracervical hypermetabolic activity with no associated soft tissue abnormality, likely due to brown fat. Incidental CT findings: None. CHEST: Linear opacity of the right lower lobe with central hypermetabolic activity measuring approximately 1.3 x 1.1 cm, SUV max of 6.6. more distal pleural lesion demonstrates low level FDG uptake which is slightly above mediastinal blood pool, 3.2. Symmetric hypermetabolic activity of the bilateral neck bases and paracervical soft tissues with no associated soft tissue abnormality, findings are favored to be due to brown fat. No hypermetabolic lymph nodes seen in the chest. Incidental CT findings: Upper lobe predominant paraseptal emphysema. Mild bilateral air trapping. Mild calcified plaque of the thoracic aorta. ABDOMEN/PELVIS: No abnormal hypermetabolic activity within the liver, pancreas, adrenal glands, or spleen. No hypermetabolic lymph nodes in the abdomen or pelvis. Incidental CT findings: Atherosclerotic disease of the abdominal aorta. SKELETON: No focal hypermetabolic activity to suggest skeletal metastasis. Incidental CT findings:  none IMPRESSION: 1. Linear opacity of the right lower lobe with central hypermetabolic activity, findings are concerning for primary lung malignancy with associated postobstructive changes. 2. No evidence of FDG avid metastatic disease in the chest, abdomen or pelvis. 3. Bilateral neck base and paraspinal FDG uptake with no associated soft tissue abnormality, likely due to brown fat. Electronically Signed  By: Yetta Glassman M.D.   On: 02/24/2021 16:14       I have independently reviewed the above radiology studies  and reviewed the findings with the patient.   Recent Lab Findings: No results found for: WBC, HGB, HCT, PLT, GLUCOSE, CHOL, TRIG, HDL, LDLDIRECT, LDLCALC, ALT, AST, NA, K, CL, CREATININE, BUN, CO2, TSH, INR, GLUF, HGBA1C   PFTs:  - FVC: 118% - FEV1: 122% -DLCO: 67%  Problem List: 1.3 cm right lower lobe pulmonary nodule with SUV uptake of 6.6. Currently vaping       Assessment / Plan:   50 year old female with right lower lobe pulmonary nodule and postobstructive atelectasis.  SUV uptake was around 6.  Her pulmonary function testing are adequate.  She is currently vaping but states that she is willing to stop today.  We will plan for combination procedure with Dr. Valeta Harms for navigational bronchoscopy followed by right robotic assisted thoracoscopy with possible lobectomy if this is positive for cancer.  If the biopsy is negative then we still we will plan for wedge resection of this area.     I  spent 40 minutes with  the patient face to face in counseling and coordination of care.    Lajuana Matte 03/08/2021 2:55 PM

## 2021-03-08 NOTE — Progress Notes (Signed)
[address]       [place] [address]             [phone]                    Tira Dimichele [facility] Medical Record #[id number] Date of Birth: June 24, 1970  Referring: Garner Nash, DO Primary Care: Marjo Bicker, MD Primary Cardiologist: None  Chief Complaint:    Chief Complaint  Patient presents with   Lung Lesion    Surgical consult, PET Scan 02/23/21, PFT's 02/23/21     History of Present Illness:    Jennette Leask 50 y.o. female referred by Dr. Valeta Harms for surgical evaluation of a right lower lobe 1.3 cm pulmonary nodule.  This was originally found on lung cancer screening.  She recently quit smoking the last several weeks but continues to vape..  On the cross-sectional imaging in addition to the nodule she also has an area of consolidation.  PET/CT was performed which showed an SUV uptake of 6.6.       Zubrod Score: At the time of surgery this patients most appropriate activity status/level should be described as: [x]     0    Normal activity, no symptoms []     1    Restricted in physical strenuous activity but ambulatory, able to do out light work []     2    Ambulatory and capable of self care, unable to do work activities, up and about               >50 % of waking hours                              []     3    Only limited self care, in bed greater than 50% of waking hours []     4    Completely disabled, no self care, confined to bed or chair []     5    Moribund   Past Medical History:  Diagnosis Date   Former smoker     Past Surgical History:  Procedure Laterality Date   THYROIDECTOMY      Family History  Problem Relation Age of Onset   Gastric cancer Mother    Lymphoma Sister      Social History   Tobacco Use  Smoking Status Former   Types: Cigarettes  Smokeless Tobacco Never    Social History   Substance and Sexual Activity  Alcohol Use None     Allergies  Allergen Reactions   Erythromycin Anaphylaxis    Azithromycin Other (See Comments)    Upset stomach Upset stomach    Hydrocod Polst-Cpm Polst Er Other (See Comments)   Sulfabenzamide Swelling    Current Outpatient Medications  Medication Sig Dispense Refill   albuterol (VENTOLIN HFA) 108 (90 Base) MCG/ACT inhaler Inhale 2 puffs into the lungs every 6 (six) hours as needed for wheezing or shortness of breath. 8 g 6   ALPRAZolam (XANAX) 0.5 MG tablet Take 0.5 mg by mouth at bedtime as needed for anxiety.     FLUoxetine (PROZAC) 10 MG tablet Take 10 mg by mouth daily.     levothyroxine (SYNTHROID) 100 MCG tablet Take 100 mcg by mouth daily.     predniSONE (DELTASONE) 5 MG tablet Take 5 mg by mouth daily with breakfast.     Tiotropium Bromide Monohydrate (SPIRIVA RESPIMAT) 1.25  MCG/ACT AERS Inhale 2 puffs into the lungs daily. 4 g 0   No current facility-administered medications for this visit.    Review of Systems  Constitutional:  Negative for fever and malaise/fatigue.  Respiratory:  Negative for cough and shortness of breath.   Cardiovascular:  Negative for chest pain.  Musculoskeletal:  Positive for back pain and myalgias.  Neurological: Negative.   Psychiatric/Behavioral:  The patient is nervous/anxious.     PHYSICAL EXAMINATION: BP 138/90    Pulse 86    Resp 20    Ht 5\' 3"  (1.6 m)    Wt 180 lb (81.6 kg)    SpO2 96% Comment: RA   BMI 31.89 kg/m  Physical Exam Constitutional:      General: She is not in acute distress.    Appearance: Normal appearance. She is normal weight. She is not ill-appearing.  HENT:     Head: Normocephalic and atraumatic.  Eyes:     Extraocular Movements: Extraocular movements intact.  Cardiovascular:     Rate and Rhythm: Normal rate.  Pulmonary:     Effort: Pulmonary effort is normal. No respiratory distress.  Abdominal:     General: Abdomen is flat. There is no distension.  Musculoskeletal:        General: Normal range of motion.     Cervical back: Normal range of motion.  Skin:     General: Skin is warm and dry.  Neurological:     General: No focal deficit present.     Mental Status: She is alert and oriented to person, place, and time.    Diagnostic Studies & Laboratory data:     Recent Radiology Findings:   NM PET Image Initial (PI) Skull Base To Thigh (F-18 FDG)  Result Date: 02/24/2021 CLINICAL DATA:  Initial treatment strategy for lung nodule. EXAM: NUCLEAR MEDICINE PET SKULL BASE TO THIGH TECHNIQUE: 9.0 mCi F-18 FDG was injected intravenously. Full-ring PET imaging was performed from the skull base to thigh after the radiotracer. CT data was obtained and used for attenuation correction and anatomic localization. Fasting blood glucose: 93 mg/dl COMPARISON:  None. FINDINGS: Mediastinal blood pool activity: SUV max 2.9 Liver activity: SUV max 3.5 NECK: No hypermetabolic lymph nodes in the neck.Symmetric paracervical hypermetabolic activity with no associated soft tissue abnormality, likely due to brown fat. Incidental CT findings: None. CHEST: Linear opacity of the right lower lobe with central hypermetabolic activity measuring approximately 1.3 x 1.1 cm, SUV max of 6.6. more distal pleural lesion demonstrates low level FDG uptake which is slightly above mediastinal blood pool, 3.2. Symmetric hypermetabolic activity of the bilateral neck bases and paracervical soft tissues with no associated soft tissue abnormality, findings are favored to be due to brown fat. No hypermetabolic lymph nodes seen in the chest. Incidental CT findings: Upper lobe predominant paraseptal emphysema. Mild bilateral air trapping. Mild calcified plaque of the thoracic aorta. ABDOMEN/PELVIS: No abnormal hypermetabolic activity within the liver, pancreas, adrenal glands, or spleen. No hypermetabolic lymph nodes in the abdomen or pelvis. Incidental CT findings: Atherosclerotic disease of the abdominal aorta. SKELETON: No focal hypermetabolic activity to suggest skeletal metastasis. Incidental CT findings:  none IMPRESSION: 1. Linear opacity of the right lower lobe with central hypermetabolic activity, findings are concerning for primary lung malignancy with associated postobstructive changes. 2. No evidence of FDG avid metastatic disease in the chest, abdomen or pelvis. 3. Bilateral neck base and paraspinal FDG uptake with no associated soft tissue abnormality, likely due to brown fat. Electronically Signed  By: Yetta Glassman M.D.   On: 02/24/2021 16:14       I have independently reviewed the above radiology studies  and reviewed the findings with the patient.   Recent Lab Findings: No results found for: WBC, HGB, HCT, PLT, GLUCOSE, CHOL, TRIG, HDL, LDLDIRECT, LDLCALC, ALT, AST, NA, K, CL, CREATININE, BUN, CO2, TSH, INR, GLUF, HGBA1C   PFTs:  - FVC: 118% - FEV1: 122% -DLCO: 67%  Problem List: 1.3 cm right lower lobe pulmonary nodule with SUV uptake of 6.6. Currently vaping       Assessment / Plan:   50 year old female with right lower lobe pulmonary nodule and postobstructive atelectasis.  SUV uptake was around 6.  Her pulmonary function testing are adequate.  She is currently vaping but states that she is willing to stop today.  We will plan for combination procedure with Dr. Valeta Harms for navigational bronchoscopy followed by right robotic assisted thoracoscopy with possible lobectomy if this is positive for cancer.  If the biopsy is negative then we still we will plan for wedge resection of this area.     I  spent 40 minutes with  the patient face to face in counseling and coordination of care.    Lajuana Matte 03/08/2021 2:55 PM

## 2021-03-09 ENCOUNTER — Telehealth: Payer: Self-pay | Admitting: Pulmonary Disease

## 2021-03-09 ENCOUNTER — Other Ambulatory Visit: Payer: Self-pay | Admitting: *Deleted

## 2021-03-09 ENCOUNTER — Encounter: Payer: Self-pay | Admitting: *Deleted

## 2021-03-09 DIAGNOSIS — I10 Essential (primary) hypertension: Secondary | ICD-10-CM | POA: Insufficient documentation

## 2021-03-09 DIAGNOSIS — R911 Solitary pulmonary nodule: Secondary | ICD-10-CM

## 2021-03-09 DIAGNOSIS — K219 Gastro-esophageal reflux disease without esophagitis: Secondary | ICD-10-CM | POA: Insufficient documentation

## 2021-03-09 DIAGNOSIS — M549 Dorsalgia, unspecified: Secondary | ICD-10-CM | POA: Insufficient documentation

## 2021-03-09 DIAGNOSIS — J302 Other seasonal allergic rhinitis: Secondary | ICD-10-CM | POA: Insufficient documentation

## 2021-03-09 DIAGNOSIS — Z6832 Body mass index (BMI) 32.0-32.9, adult: Secondary | ICD-10-CM | POA: Insufficient documentation

## 2021-03-09 DIAGNOSIS — R059 Cough, unspecified: Secondary | ICD-10-CM | POA: Insufficient documentation

## 2021-03-09 DIAGNOSIS — R6889 Other general symptoms and signs: Secondary | ICD-10-CM | POA: Insufficient documentation

## 2021-03-09 NOTE — Telephone Encounter (Signed)
-----   Message from Laury Deep, RN sent at 03/09/2021 11:25 AM EST ----- Patient is all set for [facility] case on Wednesday 12/28 @ 7:30am.  Thanks, Thurmond Butts

## 2021-03-09 NOTE — Telephone Encounter (Signed)
PCCM:  Orders placed for bronchoscopy and dye mark prior RATs on 03/22/2021. Super D ordered   Garner Nash, DO [facility] 03/09/2021 3:39 PM

## 2021-03-10 ENCOUNTER — Telehealth: Payer: Self-pay | Admitting: Pulmonary Disease

## 2021-03-10 ENCOUNTER — Encounter: Payer: BC Managed Care – PPO | Admitting: Thoracic Surgery (Cardiothoracic Vascular Surgery)

## 2021-03-10 NOTE — Telephone Encounter (Signed)
Pt has been scheduled for 12/28 at 7:30.  She will need to get covid test that morning prior to procedure.  I spoke to [facility] and verified pt still to arrive at 5:30.  Pt will get CT on 12/23 at 3:30 at [facility].  Spoke to pt & gave her appt info.

## 2021-03-13 ENCOUNTER — Telehealth: Payer: Self-pay | Admitting: Pulmonary Disease

## 2021-03-15 ENCOUNTER — Ambulatory Visit: Payer: BC Managed Care – PPO | Admitting: Pulmonary Disease

## 2021-03-15 NOTE — Telephone Encounter (Signed)
Judeen Hammans - did you look at changing locations for this pt's CT?

## 2021-03-17 ENCOUNTER — Ambulatory Visit (INDEPENDENT_AMBULATORY_CARE_PROVIDER_SITE_OTHER)
Admission: RE | Admit: 2021-03-17 | Discharge: 2021-03-17 | Disposition: A | Discharge status: Home / Self Care | Payer: BC Managed Care – PPO | Source: Ambulatory Visit | Attending: Pulmonary Disease | Admitting: Pulmonary Disease

## 2021-03-17 ENCOUNTER — Other Ambulatory Visit: Payer: Self-pay

## 2021-03-17 DIAGNOSIS — R911 Solitary pulmonary nodule: Secondary | ICD-10-CM

## 2021-03-17 NOTE — Telephone Encounter (Addendum)
Dr Valeta Harms sent me another message about this a few days ago.  Pt to have bronch on Wed and I explained to pt then that CT will need to be done in [place] so we could get the disc to [facility].  I called pt this morning and talked to her about it again.  She is understanding.  Nothing further needed.

## 2021-03-17 NOTE — Pre-Procedure Instructions (Addendum)
Surgical Instructions    Your procedure is scheduled on Wednesday 03/22/21.   Report to [facility] Main Entrance "A" at 05:45 A.M., then check in with the Admitting office.  Call this number if you have problems the morning of surgery:  [phone]   If you have any questions prior to your surgery date call [phone]: Open Monday-Friday 8am-4pm    Remember:  Do not eat or drink after midnight the night before your surgery     Take these medicines the morning of surgery with A SIP OF WATER   levothyroxine (SYNTHROID)   Tiotropium Bromide Monohydrate (SPIRIVA RESPIMAT)   Take these medicines if needed:   albuterol (VENTOLIN HFA)   ALPRAZolam (XANAX)  tetrahydrozoline-zinc (VISINE-AC)  As of today, STOP taking any Aspirin (unless otherwise instructed by your surgeon) Aleve, Naproxen, Ibuprofen, Motrin, Advil, Goody's, BC's, all herbal medications, fish oil, and all vitamins.     After your COVID test   You are not required to quarantine however you are required to wear a well-fitting mask when you are out and around people not in your household.  If your mask becomes wet or soiled, replace with a new one.  Wash your hands often with soap and water for 20 seconds or clean your hands with an alcohol-based hand sanitizer that contains at least 60% alcohol.  Do not share personal items.  Notify your provider: if you are in close contact with someone who has COVID  or if you develop a fever of 100.4 or greater, sneezing, cough, sore throat, shortness of breath or body aches.             Do not wear jewelry or makeup Do not wear lotions, powders, perfumes/colognes, or deodorant. Do not shave 48 hours prior to surgery.  Men may shave face and neck. Do not bring valuables to the hospital. DO Not wear nail polish, gel polish, artificial nails, or any other type of covering on natural nails including finger and toenails. If patients have artificial nails, gel coating, etc.  that need to be removed by a nail salon, please have this removed prior to surgery or surgery may need to be canceled/delayed if the surgeon/ anesthesia feels like the patient is unable to be adequately monitored.             [facility] is not responsible for any belongings or valuables.  Do NOT Smoke (Tobacco/Vaping)  24 hours prior to your procedure  If you use a CPAP at night, you may bring your mask for your overnight stay.   Contacts, glasses, hearing aids, dentures or partials may not be worn into surgery, please bring cases for these belongings   For patients admitted to the hospital, discharge time will be determined by your treatment team.   Patients discharged the day of surgery will not be allowed to drive home, and someone needs to stay with them for 24 hours.  NO VISITORS WILL BE ALLOWED IN PRE-OP WHERE PATIENTS ARE PREPPED FOR SURGERY.  ONLY 1 SUPPORT PERSON MAY BE PRESENT IN THE WAITING ROOM WHILE YOU ARE IN SURGERY.  IF YOU ARE TO BE ADMITTED, ONCE YOU ARE IN YOUR ROOM YOU WILL BE ALLOWED TWO (2) VISITORS. 1 (ONE) VISITOR MAY STAY OVERNIGHT BUT MUST ARRIVE TO THE ROOM BY 8pm.  Minor children may have two parents present. Special consideration for safety and communication needs will be reviewed on a case by case basis.  Special instructions:    Oral Hygiene is  also important to reduce your risk of infection.  Remember - BRUSH YOUR TEETH THE MORNING OF SURGERY WITH YOUR REGULAR TOOTHPASTE   [facility]- Preparing For Surgery  Before surgery, you can play an important role. Because skin is not sterile, your skin needs to be as free of germs as possible. You can reduce the number of germs on your skin by washing with CHG (chlorahexidine gluconate) Soap before surgery.  CHG is an antiseptic cleaner which kills germs and bonds with the skin to continue killing germs even after washing.     Please do not use if you have an allergy to CHG or antibacterial soaps. If your skin  becomes reddened/irritated stop using the CHG.  Do not shave (including legs and underarms) for at least 48 hours prior to first CHG shower. It is OK to shave your face.  Please follow these instructions carefully.     Shower the NIGHT BEFORE SURGERY and the MORNING OF SURGERY with CHG Soap.   If you chose to wash your hair, wash your hair first as usual with your normal shampoo. After you shampoo, rinse your hair and body thoroughly to remove the shampoo.  Then [employer] and genitals (private parts) with your normal soap and rinse thoroughly to remove soap.  After that Use CHG Soap as you would any other liquid soap. You can apply CHG directly to the skin and wash gently with a scrungie or a clean washcloth.   Apply the CHG Soap to your body ONLY FROM THE NECK DOWN.  Do not use on open wounds or open sores. Avoid contact with your eyes, ears, mouth and genitals (private parts). Wash Face and genitals (private parts)  with your normal soap.   Wash thoroughly, paying special attention to the area where your surgery will be performed.  Thoroughly rinse your body with warm water from the neck down.  DO NOT shower/wash with your normal soap after using and rinsing off the CHG Soap.  Pat yourself dry with a CLEAN TOWEL.  Wear CLEAN PAJAMAS to bed the night before surgery  Place CLEAN SHEETS on your bed the night before your surgery  DO NOT SLEEP WITH PETS.   Day of Surgery:  Take a shower with CHG soap. Wear Clean/Comfortable clothing the morning of surgery Do not apply any deodorants/lotions.   Remember to brush your teeth WITH YOUR REGULAR TOOTHPASTE.   Please read over the following fact sheets that you were given.

## 2021-03-21 ENCOUNTER — Encounter (HOSPITAL_COMMUNITY): Payer: Self-pay | Admitting: Vascular Surgery

## 2021-03-21 ENCOUNTER — Encounter (HOSPITAL_COMMUNITY): Payer: Self-pay

## 2021-03-21 ENCOUNTER — Encounter (HOSPITAL_COMMUNITY)
Admission: RE | Admit: 2021-03-21 | Discharge: 2021-03-21 | Disposition: A | Discharge status: Home / Self Care | Payer: BC Managed Care – PPO | Source: Ambulatory Visit | Attending: Pulmonary Disease | Admitting: Pulmonary Disease

## 2021-03-21 ENCOUNTER — Encounter (HOSPITAL_COMMUNITY): Payer: Self-pay | Admitting: Anesthesiology

## 2021-03-21 ENCOUNTER — Other Ambulatory Visit: Payer: Self-pay

## 2021-03-21 ENCOUNTER — Ambulatory Visit (HOSPITAL_COMMUNITY)
Admission: RE | Admit: 2021-03-21 | Discharge: 2021-03-21 | Disposition: A | Discharge status: Home / Self Care | Payer: BC Managed Care – PPO | Source: Ambulatory Visit | Attending: Thoracic Surgery (Cardiothoracic Vascular Surgery) | Admitting: Thoracic Surgery (Cardiothoracic Vascular Surgery)

## 2021-03-21 VITALS — BP 132/93 | HR 68 | Temp 98.2°F | Resp 18 | Ht 63.0 in | Wt 181.0 lb

## 2021-03-21 DIAGNOSIS — R911 Solitary pulmonary nodule: Secondary | ICD-10-CM | POA: Insufficient documentation

## 2021-03-21 DIAGNOSIS — Z01818 Encounter for other preprocedural examination: Secondary | ICD-10-CM | POA: Insufficient documentation

## 2021-03-21 DIAGNOSIS — Z20822 Contact with and (suspected) exposure to covid-19: Secondary | ICD-10-CM | POA: Insufficient documentation

## 2021-03-21 HISTORY — DX: Other specified postprocedural states: Z98.890

## 2021-03-21 HISTORY — DX: Depression, unspecified: F32.A

## 2021-03-21 HISTORY — DX: Pneumonia, unspecified organism: J18.9

## 2021-03-21 HISTORY — DX: Malignant (primary) neoplasm, unspecified: C80.1

## 2021-03-21 HISTORY — DX: Anxiety disorder, unspecified: F41.9

## 2021-03-21 HISTORY — DX: Anemia, unspecified: D64.9

## 2021-03-21 LAB — URINALYSIS, MICROSCOPIC (REFLEX)

## 2021-03-21 LAB — COMPREHENSIVE METABOLIC PANEL
ALT: 15 U/L (ref 0–44)
AST: 20 U/L (ref 15–41)
Albumin: 3.5 g/dL (ref 3.5–5.0)
Alkaline Phosphatase: 79 U/L (ref 38–126)
Anion gap: 8 (ref 5–15)
BUN: 13 mg/dL (ref 6–20)
CO2: 21 mmol/L — ABNORMAL LOW (ref 22–32)
Calcium: 8.8 mg/dL — ABNORMAL LOW (ref 8.9–10.3)
Chloride: 108 mmol/L (ref 98–111)
Creatinine, Ser: 0.85 mg/dL (ref 0.44–1.00)
GFR, Estimated: >60 mL/min (ref >60)
Glucose, Bld: 100 mg/dL — ABNORMAL HIGH (ref 70–99)
Potassium: 4.2 mmol/L (ref 3.5–5.1)
Sodium: 137 mmol/L (ref 135–145)
Total Bilirubin: 0.6 mg/dL (ref 0.3–1.2)
Total Protein: 6.4 g/dL — ABNORMAL LOW (ref 6.5–8.1)

## 2021-03-21 LAB — BLOOD GAS, ARTERIAL
Acid-Base Excess: 1.1 mmol/L (ref 0.0–2.0)
Bicarbonate: 25.2 mmol/L (ref 20.0–28.0)
Drawn by: 60286
FIO2: 21
O2 Saturation: 97.9 %
Patient temperature: 37
pCO2 arterial: 40.2 mmHg (ref 32.0–48.0)
pH, Arterial: 7.414 (ref 7.350–7.450)
pO2, Arterial: 105 mmHg (ref 83.0–108.0)

## 2021-03-21 LAB — URINALYSIS, ROUTINE W REFLEX MICROSCOPIC
Bilirubin Urine: NEGATIVE
Glucose, UA: NEGATIVE mg/dL
Ketones, ur: NEGATIVE mg/dL
Leukocytes,Ua: NEGATIVE
Nitrite: NEGATIVE
Protein, ur: NEGATIVE mg/dL
Specific Gravity, Urine: 1.025 (ref 1.005–1.030)
pH: 6 (ref 5.0–8.0)

## 2021-03-21 LAB — SARS CORONAVIRUS 2 (TAT 6-24 HRS): SARS Coronavirus 2: NEGATIVE

## 2021-03-21 LAB — CBC
HCT: 39.4 % (ref 36.0–46.0)
Hemoglobin: 12.8 g/dL (ref 12.0–15.0)
MCH: 36 pg — ABNORMAL HIGH (ref 26.0–34.0)
MCHC: 32.5 g/dL (ref 30.0–36.0)
MCV: 110.7 fL — ABNORMAL HIGH (ref 80.0–100.0)
Platelets: 348 10*3/uL (ref 150–400)
RBC: 3.56 MIL/uL — ABNORMAL LOW (ref 3.87–5.11)
RDW: 14.4 % (ref 11.5–15.5)
WBC: 9.9 10*3/uL (ref 4.0–10.5)
nRBC: 0 % (ref 0.0–0.2)

## 2021-03-21 LAB — TYPE AND SCREEN
ABO/RH(D): O POS
Antibody Screen: NEGATIVE

## 2021-03-21 LAB — SURGICAL PCR SCREEN
MRSA, PCR: NEGATIVE
Staphylococcus aureus: NEGATIVE

## 2021-03-21 LAB — APTT: aPTT: 27 seconds (ref 24–36)

## 2021-03-21 LAB — PROTIME-INR
INR: 0.9 (ref 0.8–1.2)
Prothrombin Time: 12.6 seconds (ref 11.4–15.2)

## 2021-03-21 NOTE — Progress Notes (Signed)
PCP - Dr. Buckner [place] Cardiologist - denies  PPM/ICD - n/a   Chest x-ray - 03/21/21- Within 72 hours EKG - 03/21/21- Within one month Stress Test - denies ECHO - denies Cardiac Cath - denies  Sleep Study - denies CPAP - n/a  Diabetes- denies  Blood Thinner Instructions: n/a Aspirin Instructions: n/a  ERAS Protcol - No. NPO  COVID TEST- 03/21/21. Pending.   Anesthesia review: No  Patient denies shortness of breath, fever, cough and chest pain at PAT appointment   All instructions explained to the patient, with a verbal understanding of the material. Patient agrees to go over the instructions while at home for a better understanding. The opportunity to ask questions was provided.

## 2021-03-21 NOTE — Anesthesia Preprocedure Evaluation (Addendum)
Anesthesia Evaluation  Patient identified by MRN, date of birth, ID band Patient awake    Reviewed: Allergy & Precautions, NPO status , Patient's Chart, lab work & pertinent test results  History of Anesthesia Complications (+) PONV and history of anesthetic complications  Airway Mallampati: II  TM Distance: >3 FB Neck ROM: Full    Dental no notable dental hx. (+) Dental Advisory Given   Pulmonary Patient abstained from smoking., former smoker,    Pulmonary exam normal        Cardiovascular hypertension, Normal cardiovascular exam     Neuro/Psych PSYCHIATRIC DISORDERS Anxiety Depression negative neurological ROS     GI/Hepatic Neg liver ROS, GERD  ,  Endo/Other  negative endocrine ROS  Renal/GU negative Renal ROS     Musculoskeletal negative musculoskeletal ROS (+)   Abdominal   Peds  Hematology negative hematology ROS (+)   Anesthesia Other Findings   Reproductive/Obstetrics                            Anesthesia Physical Anesthesia Plan  ASA: 2  Anesthesia Plan: General   Post-op Pain Management: Tylenol PO (pre-op) and Celebrex PO (pre-op)   Induction:   PONV Risk Score and Plan: 4 or greater and Ondansetron, Dexamethasone, Midazolam and Scopolamine patch - Pre-op  Airway Management Planned: Double Lumen EBT  Additional Equipment:   Intra-op Plan:   Post-operative Plan: Extubation in OR  Informed Consent: I have reviewed the patients History and Physical, chart, labs and discussed the procedure including the risks, benefits and alternatives for the proposed anesthesia with the patient or authorized representative who has indicated his/her understanding and acceptance.     Dental advisory given  Plan Discussed with: Anesthesiologist and CRNA  Anesthesia Plan Comments: ([place])     Anesthesia Quick Evaluation

## 2021-03-22 ENCOUNTER — Ambulatory Visit (HOSPITAL_COMMUNITY): Payer: BC Managed Care – PPO | Admitting: Anesthesiology

## 2021-03-22 ENCOUNTER — Ambulatory Visit (HOSPITAL_COMMUNITY): Payer: BC Managed Care – PPO

## 2021-03-22 ENCOUNTER — Inpatient Hospital Stay (HOSPITAL_COMMUNITY)
Admission: AD | Admit: 2021-03-22 | Discharge: 2021-03-24 | DRG: 164 | Disposition: A | Discharge status: Home / Self Care | Payer: BC Managed Care – PPO | Attending: Thoracic Surgery (Cardiothoracic Vascular Surgery) | Admitting: Thoracic Surgery (Cardiothoracic Vascular Surgery)

## 2021-03-22 ENCOUNTER — Encounter (HOSPITAL_COMMUNITY): Payer: Self-pay | Admitting: Pulmonary Disease

## 2021-03-22 ENCOUNTER — Encounter (HOSPITAL_COMMUNITY)
Admission: AD | Disposition: A | Discharge status: Home / Self Care | Payer: Self-pay | Source: Home / Self Care | Attending: Thoracic Surgery (Cardiothoracic Vascular Surgery)

## 2021-03-22 ENCOUNTER — Inpatient Hospital Stay (HOSPITAL_COMMUNITY): Payer: BC Managed Care – PPO

## 2021-03-22 DIAGNOSIS — E890 Postprocedural hypothyroidism: Secondary | ICD-10-CM | POA: Diagnosis present

## 2021-03-22 DIAGNOSIS — F17290 Nicotine dependence, other tobacco product, uncomplicated: Secondary | ICD-10-CM | POA: Diagnosis present

## 2021-03-22 DIAGNOSIS — Z881 Allergy status to other antibiotic agents status: Secondary | ICD-10-CM | POA: Diagnosis not present

## 2021-03-22 DIAGNOSIS — J439 Emphysema, unspecified: Secondary | ICD-10-CM | POA: Diagnosis present

## 2021-03-22 DIAGNOSIS — Z882 Allergy status to sulfonamides status: Secondary | ICD-10-CM | POA: Diagnosis not present

## 2021-03-22 DIAGNOSIS — R911 Solitary pulmonary nodule: Secondary | ICD-10-CM | POA: Diagnosis not present

## 2021-03-22 DIAGNOSIS — J9811 Atelectasis: Secondary | ICD-10-CM | POA: Diagnosis present

## 2021-03-22 DIAGNOSIS — J302 Other seasonal allergic rhinitis: Secondary | ICD-10-CM | POA: Diagnosis present

## 2021-03-22 DIAGNOSIS — I10 Essential (primary) hypertension: Secondary | ICD-10-CM | POA: Diagnosis present

## 2021-03-22 DIAGNOSIS — R599 Enlarged lymph nodes, unspecified: Secondary | ICD-10-CM | POA: Diagnosis present

## 2021-03-22 DIAGNOSIS — Z20822 Contact with and (suspected) exposure to covid-19: Secondary | ICD-10-CM | POA: Diagnosis present

## 2021-03-22 DIAGNOSIS — Z79899 Other long term (current) drug therapy: Secondary | ICD-10-CM | POA: Diagnosis not present

## 2021-03-22 DIAGNOSIS — K219 Gastro-esophageal reflux disease without esophagitis: Secondary | ICD-10-CM | POA: Diagnosis present

## 2021-03-22 DIAGNOSIS — C3431 Malignant neoplasm of lower lobe, right bronchus or lung: Principal | ICD-10-CM | POA: Diagnosis present

## 2021-03-22 DIAGNOSIS — Z902 Acquired absence of lung [part of]: Secondary | ICD-10-CM

## 2021-03-22 DIAGNOSIS — Z419 Encounter for procedure for purposes other than remedying health state, unspecified: Secondary | ICD-10-CM

## 2021-03-22 DIAGNOSIS — Z7951 Long term (current) use of inhaled steroids: Secondary | ICD-10-CM | POA: Diagnosis not present

## 2021-03-22 HISTORY — PX: INTERCOSTAL NERVE BLOCK: SHX5021

## 2021-03-22 HISTORY — PX: BRONCHIAL BRUSHINGS: SHX5108

## 2021-03-22 HISTORY — PX: BRONCHIAL BIOPSY: SHX5109

## 2021-03-22 HISTORY — PX: VIDEO BRONCHOSCOPY WITH RADIAL ENDOBRONCHIAL ULTRASOUND: SHX6849

## 2021-03-22 HISTORY — PX: BRONCHIAL NEEDLE ASPIRATION BIOPSY: SHX5106

## 2021-03-22 HISTORY — PX: FIDUCIAL MARKER PLACEMENT: SHX6858

## 2021-03-22 LAB — ABO/RH: ABO/RH(D): O POS

## 2021-03-22 SURGERY — LOBECTOMY, LUNG, ROBOT-ASSISTED, USING VATS
Anesthesia: General | Site: Chest | Laterality: Right

## 2021-03-22 SURGERY — BRONCHOSCOPY, WITH BIOPSY USING ELECTROMAGNETIC NAVIGATION
Anesthesia: General

## 2021-03-22 MED ORDER — FENTANYL CITRATE (PF) 100 MCG/2ML IJ SOLN
25.0000 ug | INTRAMUSCULAR | Status: DC | PRN
  Administered 2021-03-22 (×2): 50 ug via INTRAVENOUS

## 2021-03-22 MED ORDER — ROCURONIUM BROMIDE 10 MG/ML (PF) SYRINGE
PREFILLED_SYRINGE | INTRAVENOUS | Status: AC
  Filled 2021-03-22: qty 20

## 2021-03-22 MED ORDER — LACTATED RINGERS IV SOLN: INTRAVENOUS | Status: DC

## 2021-03-22 MED ORDER — BUPIVACAINE HCL (PF) 0.5 % IJ SOLN
INTRAMUSCULAR | Status: AC
  Filled 2021-03-22: qty 30

## 2021-03-22 MED ORDER — ACETAMINOPHEN 500 MG PO TABS
1000.0000 mg | ORAL_TABLET | Freq: Once | ORAL | Status: AC
  Administered 2021-03-22: 06:00:00 1000 mg via ORAL
  Filled 2021-03-22: qty 2

## 2021-03-22 MED ORDER — LIDOCAINE 2% (20 MG/ML) 5 ML SYRINGE
INTRAMUSCULAR | Status: DC | PRN
  Administered 2021-03-22: 100 mg via INTRAVENOUS

## 2021-03-22 MED ORDER — VANCOMYCIN HCL IN DEXTROSE 1-5 GM/200ML-% IV SOLN
1000.0000 mg | INTRAVENOUS | Status: AC
  Administered 2021-03-22: 06:00:00 1000 mg via INTRAVENOUS
  Filled 2021-03-22: qty 200

## 2021-03-22 MED ORDER — MIDAZOLAM HCL 2 MG/2ML IJ SOLN
INTRAMUSCULAR | Status: DC | PRN
  Administered 2021-03-22: 2 mg via INTRAVENOUS

## 2021-03-22 MED ORDER — CHLORHEXIDINE GLUCONATE 0.12 % MT SOLN
15.0000 mL | Freq: Once | OROMUCOSAL | Status: AC
  Administered 2021-03-22: 06:00:00 15 mL via OROMUCOSAL
  Filled 2021-03-22: qty 15

## 2021-03-22 MED ORDER — FENTANYL CITRATE (PF) 250 MCG/5ML IJ SOLN
INTRAMUSCULAR | Status: DC | PRN
  Administered 2021-03-22: 150 ug via INTRAVENOUS
  Administered 2021-03-22 (×4): 50 ug via INTRAVENOUS

## 2021-03-22 MED ORDER — CEFAZOLIN SODIUM-DEXTROSE 2-4 GM/100ML-% IV SOLN
2.0000 g | Freq: Three times a day (TID) | INTRAVENOUS | Status: AC
  Administered 2021-03-22 – 2021-03-23 (×2): 2 g via INTRAVENOUS
  Filled 2021-03-22 (×2): qty 100

## 2021-03-22 MED ORDER — TIOTROPIUM BROMIDE MONOHYDRATE 1.25 MCG/ACT IN AERS: 2.0000 | INHALATION_SPRAY | Freq: Every day | RESPIRATORY_TRACT | Status: DC

## 2021-03-22 MED ORDER — SUGAMMADEX SODIUM 200 MG/2ML IV SOLN
INTRAVENOUS | Status: DC | PRN
  Administered 2021-03-22 (×2): 100 mg via INTRAVENOUS

## 2021-03-22 MED ORDER — ONDANSETRON HCL 4 MG/2ML IJ SOLN
INTRAMUSCULAR | Status: DC | PRN
  Administered 2021-03-22: 4 mg via INTRAVENOUS

## 2021-03-22 MED ORDER — ACETAMINOPHEN 160 MG/5ML PO SOLN: 1000.0000 mg | Freq: Four times a day (QID) | ORAL | Status: DC

## 2021-03-22 MED ORDER — DEXAMETHASONE SODIUM PHOSPHATE 10 MG/ML IJ SOLN
INTRAMUSCULAR | Status: DC | PRN
  Administered 2021-03-22: 5 mg via INTRAVENOUS

## 2021-03-22 MED ORDER — ALBUMIN HUMAN 5 % IV SOLN: INTRAVENOUS | Status: DC | PRN

## 2021-03-22 MED ORDER — PROPOFOL 10 MG/ML IV BOLUS
INTRAVENOUS | Status: AC
  Filled 2021-03-22: qty 20

## 2021-03-22 MED ORDER — BUPIVACAINE LIPOSOME 1.3 % IJ SUSP
INTRAMUSCULAR | Status: DC | PRN
  Administered 2021-03-22: 08:00:00 100 mL

## 2021-03-22 MED ORDER — MIDAZOLAM HCL 2 MG/2ML IJ SOLN
INTRAMUSCULAR | Status: AC
  Filled 2021-03-22: qty 2

## 2021-03-22 MED ORDER — ORAL CARE MOUTH RINSE: 15.0000 mL | Freq: Once | OROMUCOSAL | Status: AC

## 2021-03-22 MED ORDER — DEXAMETHASONE SODIUM PHOSPHATE 10 MG/ML IJ SOLN
INTRAMUSCULAR | Status: AC
  Filled 2021-03-22: qty 1

## 2021-03-22 MED ORDER — ALPRAZOLAM 0.5 MG PO TABS
0.2500 mg | ORAL_TABLET | Freq: Two times a day (BID) | ORAL | Status: DC | PRN
  Administered 2021-03-22: 16:00:00 0.25 mg via ORAL
  Filled 2021-03-22: qty 1

## 2021-03-22 MED ORDER — FENTANYL CITRATE (PF) 250 MCG/5ML IJ SOLN
INTRAMUSCULAR | Status: AC
  Filled 2021-03-22: qty 5

## 2021-03-22 MED ORDER — PROMETHAZINE HCL 25 MG/ML IJ SOLN
6.2500 mg | INTRAMUSCULAR | Status: DC | PRN
Start: 2021-03-22 — End: 2021-03-22

## 2021-03-22 MED ORDER — PHENYLEPHRINE HCL-NACL 20-0.9 MG/250ML-% IV SOLN
INTRAVENOUS | Status: DC | PRN
  Administered 2021-03-22: 20 ug/min via INTRAVENOUS

## 2021-03-22 MED ORDER — ROCURONIUM BROMIDE 10 MG/ML (PF) SYRINGE
PREFILLED_SYRINGE | INTRAVENOUS | Status: DC | PRN
  Administered 2021-03-22: 100 mg via INTRAVENOUS
  Administered 2021-03-22: 50 mg via INTRAVENOUS
  Administered 2021-03-22: 20 mg via INTRAVENOUS

## 2021-03-22 MED ORDER — BISACODYL 5 MG PO TBEC
10.0000 mg | DELAYED_RELEASE_TABLET | Freq: Every day | ORAL | Status: DC
  Administered 2021-03-22 – 2021-03-24 (×3): 10 mg via ORAL
  Filled 2021-03-22 (×3): qty 2

## 2021-03-22 MED ORDER — BUPIVACAINE LIPOSOME 1.3 % IJ SUSP
INTRAMUSCULAR | Status: AC
  Filled 2021-03-22: qty 20

## 2021-03-22 MED ORDER — AMISULPRIDE (ANTIEMETIC) 5 MG/2ML IV SOLN: 10.0000 mg | Freq: Once | INTRAVENOUS | Status: DC | PRN

## 2021-03-22 MED ORDER — UMECLIDINIUM BROMIDE 62.5 MCG/ACT IN AEPB
1.0000 | INHALATION_SPRAY | Freq: Every day | RESPIRATORY_TRACT | Status: DC
  Administered 2021-03-23: 08:00:00 1 via RESPIRATORY_TRACT
  Filled 2021-03-22: qty 7

## 2021-03-22 MED ORDER — TRAMADOL HCL 50 MG PO TABS
50.0000 mg | ORAL_TABLET | Freq: Four times a day (QID) | ORAL | Status: DC | PRN
  Administered 2021-03-22 – 2021-03-23 (×3): 100 mg via ORAL
  Filled 2021-03-22 (×3): qty 2

## 2021-03-22 MED ORDER — LIDOCAINE 2% (20 MG/ML) 5 ML SYRINGE
INTRAMUSCULAR | Status: AC
  Filled 2021-03-22: qty 5

## 2021-03-22 MED ORDER — LEVOTHYROXINE SODIUM 100 MCG PO TABS
100.0000 ug | ORAL_TABLET | Freq: Every day | ORAL | Status: DC
Start: 2021-03-23 — End: 2021-03-24
  Administered 2021-03-23 – 2021-03-24 (×2): 100 ug via ORAL
  Filled 2021-03-22 (×2): qty 1

## 2021-03-22 MED ORDER — ONDANSETRON HCL 4 MG/2ML IJ SOLN
4.0000 mg | Freq: Four times a day (QID) | INTRAMUSCULAR | Status: DC | PRN
  Administered 2021-03-22 – 2021-03-23 (×4): 4 mg via INTRAVENOUS
  Filled 2021-03-22 (×4): qty 2

## 2021-03-22 MED ORDER — SENNOSIDES-DOCUSATE SODIUM 8.6-50 MG PO TABS
1.0000 | ORAL_TABLET | Freq: Every day | ORAL | Status: DC
  Administered 2021-03-22 – 2021-03-23 (×2): 1 via ORAL
  Filled 2021-03-22 (×2): qty 1

## 2021-03-22 MED ORDER — DIPHENHYDRAMINE HCL 50 MG/ML IJ SOLN
INTRAMUSCULAR | Status: DC | PRN
  Administered 2021-03-22: 12.5 mg via INTRAVENOUS

## 2021-03-22 MED ORDER — PROPOFOL 10 MG/ML IV BOLUS
INTRAVENOUS | Status: DC | PRN
  Administered 2021-03-22: 160 mg via INTRAVENOUS

## 2021-03-22 MED ORDER — PROPOFOL 500 MG/50ML IV EMUL
INTRAVENOUS | Status: DC | PRN
  Administered 2021-03-22: 25 ug/kg/min via INTRAVENOUS

## 2021-03-22 MED ORDER — NAPHAZOLINE-GLYCERIN 0.012-0.25 % OP SOLN
1.0000 [drp] | Freq: Four times a day (QID) | OPHTHALMIC | Status: DC | PRN
Start: 2021-03-22 — End: 2021-03-24

## 2021-03-22 MED ORDER — ALBUTEROL SULFATE HFA 108 (90 BASE) MCG/ACT IN AERS
2.0000 | INHALATION_SPRAY | Freq: Four times a day (QID) | RESPIRATORY_TRACT | Status: DC | PRN
  Filled 2021-03-22: qty 6.7

## 2021-03-22 MED ORDER — SCOPOLAMINE 1 MG/3DAYS TD PT72
1.0000 | MEDICATED_PATCH | TRANSDERMAL | Status: DC
  Administered 2021-03-22: 06:00:00 1.5 mg via TRANSDERMAL
  Filled 2021-03-22: qty 1

## 2021-03-22 MED ORDER — METHYLENE BLUE 0.5 % INJ SOLN
INTRAVENOUS | Status: DC | PRN
  Administered 2021-03-22: 09:00:00 1.5 mL

## 2021-03-22 MED ORDER — KETOROLAC TROMETHAMINE 15 MG/ML IJ SOLN
15.0000 mg | Freq: Four times a day (QID) | INTRAMUSCULAR | Status: AC
  Administered 2021-03-22 – 2021-03-24 (×8): 15 mg via INTRAVENOUS
  Filled 2021-03-22 (×8): qty 1

## 2021-03-22 MED ORDER — HEMOSTATIC AGENTS (NO CHARGE) OPTIME
TOPICAL | Status: DC | PRN
  Administered 2021-03-22 (×2): 1 via TOPICAL

## 2021-03-22 MED ORDER — FLUOXETINE HCL 10 MG PO CAPS
10.0000 mg | ORAL_CAPSULE | Freq: Every day | ORAL | Status: DC
  Administered 2021-03-22 – 2021-03-23 (×2): 10 mg via ORAL
  Filled 2021-03-22 (×3): qty 1

## 2021-03-22 MED ORDER — ACETAMINOPHEN 500 MG PO TABS
1000.0000 mg | ORAL_TABLET | Freq: Four times a day (QID) | ORAL | Status: DC
  Administered 2021-03-22 – 2021-03-24 (×7): 1000 mg via ORAL
  Filled 2021-03-22 (×8): qty 2

## 2021-03-22 MED ORDER — ENOXAPARIN SODIUM 40 MG/0.4ML IJ SOSY
40.0000 mg | PREFILLED_SYRINGE | Freq: Every day | INTRAMUSCULAR | Status: DC
  Administered 2021-03-23 – 2021-03-24 (×3): 40 mg via SUBCUTANEOUS
  Filled 2021-03-22 (×3): qty 0.4

## 2021-03-22 MED ORDER — LACTATED RINGERS IV SOLN: INTRAVENOUS | Status: DC | PRN

## 2021-03-22 MED ORDER — ONDANSETRON HCL 4 MG/2ML IJ SOLN
INTRAMUSCULAR | Status: AC
  Filled 2021-03-22: qty 2

## 2021-03-22 MED ORDER — FLUOXETINE HCL 20 MG PO TABS
10.0000 mg | ORAL_TABLET | Freq: Every day | ORAL | Status: DC
  Filled 2021-03-22: qty 1

## 2021-03-22 MED ORDER — FENTANYL CITRATE (PF) 100 MCG/2ML IJ SOLN
INTRAMUSCULAR | Status: AC
  Filled 2021-03-22: qty 2

## 2021-03-22 MED ORDER — 0.9 % SODIUM CHLORIDE (POUR BTL) OPTIME
TOPICAL | Status: DC | PRN
  Administered 2021-03-22: 08:00:00 1000 mL

## 2021-03-22 SURGICAL SUPPLY — 111 items
BLADE CLIPPER SURG (BLADE) ×3 IMPLANT
BLADE SURG 11 STRL SS (BLADE) ×3 IMPLANT
BNDG COHESIVE 6X5 TAN STRL LF (GAUZE/BANDAGES/DRESSINGS) ×3 IMPLANT
CANISTER SUCT 3000ML PPV (MISCELLANEOUS) ×6 IMPLANT
CANNULA REDUC XI 12-8 STAPL (CANNULA) ×2
CANNULA REDUC XI 12-8MM STAPL (CANNULA) ×2
CANNULA REDUCER 12-8 DVNC XI (CANNULA) ×2 IMPLANT
CATH TROCAR 20FR (CATHETERS) IMPLANT
CHLORAPREP W/TINT 26 (MISCELLANEOUS) ×3 IMPLANT
CLIP VESOCCLUDE MED 6/CT (CLIP) IMPLANT
CNTNR URN SCR LID CUP LEK RST (MISCELLANEOUS) ×5 IMPLANT
CONN ST 1/4X3/8  BEN (MISCELLANEOUS) ×2
CONN ST 1/4X3/8 BEN (MISCELLANEOUS) IMPLANT
CONT SPEC 4OZ STRL OR WHT (MISCELLANEOUS) ×18
DEFOGGER SCOPE WARMER CLEARIFY (MISCELLANEOUS) ×3 IMPLANT
DERMABOND ADVANCED (GAUZE/BANDAGES/DRESSINGS) ×2
DERMABOND ADVANCED .7 DNX12 (GAUZE/BANDAGES/DRESSINGS) ×1 IMPLANT
DISSECTOR BLUNT TIP ENDO 5MM (MISCELLANEOUS) IMPLANT
DRAIN CHANNEL 28F RND 3/8 FF (WOUND CARE) ×2 IMPLANT
DRAPE ARM DVNC X/XI (DISPOSABLE) ×4 IMPLANT
DRAPE COLUMN DVNC XI (DISPOSABLE) ×1 IMPLANT
DRAPE CV SPLIT W-CLR ANES SCRN (DRAPES) ×3 IMPLANT
DRAPE DA VINCI XI ARM (DISPOSABLE) ×8
DRAPE DA VINCI XI COLUMN (DISPOSABLE) ×2
DRAPE ORTHO SPLIT 77X108 STRL (DRAPES) ×2
DRAPE SURG ORHT 6 SPLT 77X108 (DRAPES) ×1 IMPLANT
ELECT BLADE 6.5 EXT (BLADE) IMPLANT
ELECT REM PT RETURN 9FT ADLT (ELECTROSURGICAL) ×3
ELECTRODE REM PT RTRN 9FT ADLT (ELECTROSURGICAL) ×1 IMPLANT
GAUZE KITTNER 4X5 RF (MISCELLANEOUS) ×6 IMPLANT
GAUZE KITTNER 4X8 (MISCELLANEOUS) ×3 IMPLANT
GAUZE SPONGE 4X4 12PLY STRL (GAUZE/BANDAGES/DRESSINGS) ×3 IMPLANT
GLOVE SURG ENC MOIS LTX SZ7.5 (GLOVE) ×6 IMPLANT
GLOVE SURG POLYISO LF SZ8 (GLOVE) ×3 IMPLANT
GOWN STRL REUS W/ TWL LRG LVL3 (GOWN DISPOSABLE) ×2 IMPLANT
GOWN STRL REUS W/ TWL XL LVL3 (GOWN DISPOSABLE) ×3 IMPLANT
GOWN STRL REUS W/TWL 2XL LVL3 (GOWN DISPOSABLE) ×3 IMPLANT
GOWN STRL REUS W/TWL LRG LVL3 (GOWN DISPOSABLE) ×4
GOWN STRL REUS W/TWL XL LVL3 (GOWN DISPOSABLE) ×6
HEMOSTAT SURGICEL 2X14 (HEMOSTASIS) ×5 IMPLANT
IRRIGATION STRYKERFLOW (MISCELLANEOUS) IMPLANT
IRRIGATOR STRYKERFLOW (MISCELLANEOUS)
IRRIGATOR SUCT 8 DISP DVNC XI (IRRIGATION / IRRIGATOR) IMPLANT
IRRIGATOR SUCTION 8MM XI DISP (IRRIGATION / IRRIGATOR) ×2
KIT BASIN OR (CUSTOM PROCEDURE TRAY) ×3 IMPLANT
KIT TURNOVER KIT B (KITS) ×3 IMPLANT
NEEDLE 22X1 1/2 (OR ONLY) (NEEDLE) ×3 IMPLANT
NS IRRIG 1000ML POUR BTL (IV SOLUTION) ×9 IMPLANT
PACK CHEST (CUSTOM PROCEDURE TRAY) ×3 IMPLANT
PAD ARMBOARD 7.5X6 YLW CONV (MISCELLANEOUS) ×15 IMPLANT
PORT ACCESS TROCAR AIRSEAL 12 (TROCAR) ×1 IMPLANT
PORT ACCESS TROCAR AIRSEAL 5M (TROCAR) ×2
POUCH ENDO CATCH II 15MM (MISCELLANEOUS) IMPLANT
RELOAD STAPLE 45 2.5 WHT DVNC (STAPLE) IMPLANT
RELOAD STAPLE 45 3.5 BLU DVNC (STAPLE) IMPLANT
RELOAD STAPLE 45 4.3 GRN DVNC (STAPLE) IMPLANT
RELOAD STAPLER 2.5X45 WHT DVNC (STAPLE) ×4 IMPLANT
RELOAD STAPLER 3.5X45 BLU DVNC (STAPLE) ×2 IMPLANT
RELOAD STAPLER 4.3X45 GRN DVNC (STAPLE) ×1 IMPLANT
RETRACTOR WOUND ALXS 19CM XSML (INSTRUMENTS) IMPLANT
RTRCTR WOUND ALEXIS 19CM XSML (INSTRUMENTS)
SCISSORS LAP 5X35 DISP (ENDOMECHANICALS) IMPLANT
SEAL CANN UNIV 5-8 DVNC XI (MISCELLANEOUS) ×2 IMPLANT
SEAL XI 5MM-8MM UNIVERSAL (MISCELLANEOUS) ×4
SEALANT PROGEL (MISCELLANEOUS) IMPLANT
SEALER LIGASURE MARYLAND 30 (ELECTROSURGICAL) IMPLANT
SET TRI-LUMEN FLTR TB AIRSEAL (TUBING) ×3 IMPLANT
SOLUTION ELECTROLUBE (MISCELLANEOUS) IMPLANT
SPONGE INTESTINAL PEANUT (DISPOSABLE) IMPLANT
SPONGE TONSIL TAPE 1 RFD (DISPOSABLE) IMPLANT
STAPLER 45 SUREFORM CVD (STAPLE) ×2
STAPLER 45 SUREFORM CVD DVNC (STAPLE) IMPLANT
STAPLER CANNULA SEAL DVNC XI (STAPLE) ×2 IMPLANT
STAPLER CANNULA SEAL XI (STAPLE) ×4
STAPLER RELOAD 2.5X45 WHITE (STAPLE) ×8
STAPLER RELOAD 2.5X45 WHT DVNC (STAPLE) ×4
STAPLER RELOAD 3.5X45 BLU DVNC (STAPLE) ×2
STAPLER RELOAD 3.5X45 BLUE (STAPLE) ×4
STAPLER RELOAD 4.3X45 GREEN (STAPLE) ×2
STAPLER RELOAD 4.3X45 GRN DVNC (STAPLE) ×1
STOPCOCK 4 WAY LG BORE MALE ST (IV SETS) ×3 IMPLANT
SUT MNCRL AB 3-0 PS2 18 (SUTURE) IMPLANT
SUT MON AB 2-0 CT1 36 (SUTURE) IMPLANT
SUT PDS AB 1 CTX 36 (SUTURE) IMPLANT
SUT PROLENE 4 0 RB 1 (SUTURE)
SUT PROLENE 4-0 RB1 .5 CRCL 36 (SUTURE) IMPLANT
SUT SILK  1 MH (SUTURE) ×2
SUT SILK 1 MH (SUTURE) ×1 IMPLANT
SUT SILK 1 TIES 10X30 (SUTURE) IMPLANT
SUT SILK 2 0 SH (SUTURE) IMPLANT
SUT SILK 2 0SH CR/8 30 (SUTURE) IMPLANT
SUT VIC AB 1 CTX 36 (SUTURE)
SUT VIC AB 1 CTX36XBRD ANBCTR (SUTURE) IMPLANT
SUT VIC AB 2-0 CT1 27 (SUTURE)
SUT VIC AB 2-0 CT1 TAPERPNT 27 (SUTURE) ×1 IMPLANT
SUT VIC AB 3-0 SH 27 (SUTURE) ×4
SUT VIC AB 3-0 SH 27X BRD (SUTURE) ×2 IMPLANT
SUT VICRYL 0 TIES 12 18 (SUTURE) ×3 IMPLANT
SUT VICRYL 0 UR6 27IN ABS (SUTURE) ×6 IMPLANT
SUT VICRYL 2 TP 1 (SUTURE) IMPLANT
SYR 10ML LL (SYRINGE) ×3 IMPLANT
SYR 20ML LL LF (SYRINGE) ×3 IMPLANT
SYR 50ML LL SCALE MARK (SYRINGE) ×3 IMPLANT
SYSTEM RETRIEVAL ANCHOR 15 (MISCELLANEOUS) ×2 IMPLANT
SYSTEM SAHARA CHEST DRAIN ATS (WOUND CARE) ×3 IMPLANT
TAPE CLOTH 4X10 WHT NS (GAUZE/BANDAGES/DRESSINGS) ×3 IMPLANT
TAPE CLOTH SURG 4X10 WHT LF (GAUZE/BANDAGES/DRESSINGS) ×2 IMPLANT
TIP APPLICATOR SPRAY EXTEND 16 (VASCULAR PRODUCTS) IMPLANT
TOWEL GREEN STERILE (TOWEL DISPOSABLE) ×3 IMPLANT
TRAY FOLEY MTR SLVR 16FR STAT (SET/KITS/TRAYS/PACK) ×3 IMPLANT
TUBING EXTENTION W/L.L. (IV SETS) ×3 IMPLANT

## 2021-03-22 NOTE — Transfer of Care (Signed)
Immediate Anesthesia Transfer of Care Note  Patient: Brittany Macias  Procedure(s) Performed: ROBOTIC ASSISTED NAVIGATIONAL BRONCHOSCOPY BRONCHIAL BIOPSIES BRONCHIAL BRUSHINGS BRONCHIAL NEEDLE ASPIRATION BIOPSIES FIDUCIAL DYE MARKING RADIAL ENDOBRONCHIAL ULTRASOUND  Patient Location: PACU  Anesthesia Type:General  Level of Consciousness: drowsy  Airway & Oxygen Therapy: Patient Spontanous Breathing and non-rebreather face mask  Post-op Assessment: Report given to RN and Post -op Vital signs reviewed and stable  Post vital signs: Reviewed and stable  Last Vitals:  Vitals Value Taken Time  BP 134/86 03/22/21 1210  Temp    Pulse 80 03/22/21 1214  Resp 18 03/22/21 1214  SpO2 100 % 03/22/21 1214  Vitals shown include unvalidated device data.  Last Pain:  Vitals:   03/22/21 0613  TempSrc:   PainSc: 6       Patients Stated Pain Goal: 1 (38/33/38 3291)  Complications: No notable events documented.

## 2021-03-22 NOTE — Anesthesia Procedure Notes (Signed)
Procedure Name: Intubation Date/Time: 03/22/2021 8:14 AM Performed by: Trinna Post., CRNA Pre-anesthesia Checklist: Patient identified, Emergency Drugs available, Suction available, Patient being monitored and Timeout performed Patient Re-evaluated:Patient Re-evaluated prior to induction Oxygen Delivery Method: Circle system utilized Preoxygenation: Pre-oxygenation with 100% oxygen Induction Type: IV induction Ventilation: Mask ventilation without difficulty Laryngoscope Size: Mac and 3 Grade View: Grade I Tube type: Oral Tube size: 8.5 mm Number of attempts: 1 Airway Equipment and Method: Stylet Placement Confirmation: ETT inserted through vocal cords under direct vision, positive ETCO2 and breath sounds checked- equal and bilateral Secured at: 22 cm Tube secured with: Tape Dental Injury: Teeth and Oropharynx as per pre-operative assessment

## 2021-03-22 NOTE — Interval H&P Note (Signed)
History and Physical Interval Note:  03/22/2021 6:56 AM  Brittany Macias  has presented today for surgery, with the diagnosis of lung nodule.  The various methods of treatment have been discussed with the patient and family. After consideration of risks, benefits and other options for treatment, the patient has consented to  Procedure(s): ROBOTIC ASSISTED NAVIGATIONAL BRONCHOSCOPY (N/A) as a surgical intervention.  The patient's history has been reviewed, patient examined, no change in status, stable for surgery.  I have reviewed the patient's chart and labs.  Questions were answered to the patient's satisfaction.     [place]

## 2021-03-22 NOTE — Plan of Care (Signed)

## 2021-03-22 NOTE — Anesthesia Procedure Notes (Signed)
Procedure Name: Intubation Date/Time: 03/22/2021 9:04 AM Performed by: Trinna Post., CRNA Pre-anesthesia Checklist: Patient identified, Emergency Drugs available, Suction available, Patient being monitored and Timeout performed Patient Re-evaluated:Patient Re-evaluated prior to induction Oxygen Delivery Method: Circle system utilized Preoxygenation: Pre-oxygenation with 100% oxygen Induction Type: IV induction Laryngoscope Size: Mac and 3 Grade View: Grade I Endobronchial tube: Left, Double lumen EBT, EBT position confirmed by auscultation and EBT position confirmed by fiberoptic bronchoscope and 37 Fr Number of attempts: 1 Airway Equipment and Method: Stylet Placement Confirmation: ETT inserted through vocal cords under direct vision, positive ETCO2 and breath sounds checked- equal and bilateral Tube secured with: Tape Dental Injury: Teeth and Oropharynx as per pre-operative assessment  Comments: 8.5 ETT placed during endo procedure was removed, and replaced with 37Fr DLT under direct visualization with MAC 3. No complications or adverse events. Position confirmed by auscultation and use of disposable bronchoscope by Dr Tobias Alexander.

## 2021-03-22 NOTE — Brief Op Note (Signed)
03/22/2021  11:22 AM  PATIENT:  Brittany Macias  50 y.o. female  PRE-OPERATIVE DIAGNOSIS:  PULMONARY NODULE  POST-OPERATIVE DIAGNOSIS:  PULMONARY NODULE  PROCEDURE:  Procedure(s):  XI ROBOTIC ASSISTED THORACOSCOPY -Right Lower Lobectomy -Lymph Node Dissection  INTERCOSTAL NERVE BLOCK (Right)  SURGEON:  Surgeon(s) and Role:    * Lightfoot, Lucile Crater, MD - Primary  PHYSICIAN ASSISTANT: Arabelle Bollig PA-C  ASSISTANTS: none   ANESTHESIA:   general  EBL:  100 mL   BLOOD ADMINISTERED:none  DRAINS:  [address]    LOCAL MEDICATIONS USED:  BUPIVICAINE   SPECIMEN:  Source of Specimen:  Right lower lobe, Lymph nodes  DISPOSITION OF SPECIMEN:  PATHOLOGY  COUNTS:  YES  TOURNIQUET:  * No tourniquets in log *  DICTATION: .Dragon Dictation  PLAN OF CARE: Admit to inpatient   PATIENT DISPOSITION:  ICU - intubated and hemodynamically stable.   Delay start of Pharmacological VTE agent (>24hrs) due to surgical blood loss or risk of bleeding: no

## 2021-03-22 NOTE — Interval H&P Note (Signed)
History and Physical Interval Note:  03/22/2021 7:56 AM  Brittany Macias  has presented today for surgery, with the diagnosis of PULMONARY NODULE.  The various methods of treatment have been discussed with the patient and family. After consideration of risks, benefits and other options for treatment, the patient has consented to  Procedure(s): XI ROBOTIC ASSISTED THORASCOPY-RIGHT LOWER LOBECTOMY (Right) INTERCOSTAL NERVE BLOCK (Right) as a surgical intervention.  The patient's history has been reviewed, patient examined, no change in status, stable for surgery.  I have reviewed the patient's chart and labs.  Questions were answered to the patient's satisfaction.     Brittany Macias

## 2021-03-22 NOTE — Anesthesia Postprocedure Evaluation (Signed)
Anesthesia Post Note  Patient: Brittany Macias  Procedure(s) Performed: ROBOTIC ASSISTED NAVIGATIONAL BRONCHOSCOPY BRONCHIAL BIOPSIES BRONCHIAL BRUSHINGS BRONCHIAL NEEDLE ASPIRATION BIOPSIES FIDUCIAL DYE MARKING RADIAL ENDOBRONCHIAL ULTRASOUND     Patient location during evaluation: PACU Anesthesia Type: General Level of consciousness: sedated Pain management: pain level controlled Vital Signs Assessment: post-procedure vital signs reviewed and stable Respiratory status: spontaneous breathing and respiratory function stable Cardiovascular status: stable Postop Assessment: no apparent nausea or vomiting Anesthetic complications: no   No notable events documented.  Last Vitals:  Vitals:   03/22/21 1255 03/22/21 1310  BP: (!) 148/73 (!) 157/75  Pulse: 71 76  Resp: 17 15  Temp:    SpO2: 97% 99%    Last Pain:  Vitals:   03/22/21 1310  TempSrc:   PainSc: Asleep                 Mohab Ashby DANIEL

## 2021-03-22 NOTE — Op Note (Signed)
° °   °  [address]       [place] [address]             [phone]        03/22/2021  Patient:  [name] Pre-Op Dx: right lower lobe pulmonary nodule   Post-op Dx:  right lower lobe carcinoma Procedure: - Robotic assisted right video thoracoscopy - Right lower lobectomy - Mediastinal lymph node sampling - Intercostal nerve block  Surgeon and Role:      * Jeffifer Rabold, Lucile Crater, MD - Primary  Assistant: Leretha Pol, PA-C  An experienced assistant was required given the complexity of this surgery and the standard of surgical care. The assistant was needed for exposure, dissection, suctioning, retraction of delicate tissues and sutures, instrument exchange and for overall help during this procedure.    Anesthesia  general EBL:  125ml Blood Administration: none Specimen:  right lower lobe.  Hilar, and lever 7 lymph nodes  Drains: 28 F argyle chest tube in right chest Counts: correct   Indications: 50 year old female with right lower lobe pulmonary nodule and postobstructive atelectasis.  SUV uptake was around 6.  Her pulmonary function testing are adequate.  She is currently vaping but states that she is willing to stop today.  We will plan for combination procedure with Dr. Valeta Harms for navigational bronchoscopy followed by right robotic assisted thoracoscopy with possible lobectomy if this is positive for cancer.  If the biopsy is negative then we still we will plan for wedge resection of this area. Findings: Frozen section showed carcinoma.  Normal anatomy.  Enlarged lymph nodes  Operative Technique: After the risks, benefits and alternatives were thoroughly discussed, the patient was brought to the operative theatre.  Anesthesia was induced, and the patient was then placed in a left lateral decubitus position and was prepped and draped in normal sterile fashion.  An appropriate surgical pause was performed, and pre-operative antibiotics were dosed  accordingly.  We began by placing our 4 robotic ports in the the 7th intercostal space targeting the hilum of the lung.  A 61mm assistant port was placed in the 9th intercostal space in the anterior axillary line.  The robot was then docked and all instruments were passed under direct visualization.    The lung was then retracted superiorly, and the inferior pulmonary ligament was divided.  The hilum was mobilized anteriorly and posteriorly.  We identified the lower lobe pulmonary vein, and after careful isolation, it was divided with a vascular stapler.  We next moved to the  fissure.  The artery was then divided with a vascular load stapler.  The bronchus to the lower lobe was then isolated.  After a test clamp, with good ventilation of the upper, and middle lobe, the bronchus was then divided.  The fissure was completed, and the specimen was passed into an endocatch bag.  It was removed from the anterior access site.    Lymph nodes were then sampled at levels 7, and hilum.  The chest was irrigated, and an air leak test was performed.  An intercostal nerve block was performed under direct visualization.  A 28 F chest with then placed, and we watch the remaining lobes re-expand.  The skin and soft tissue were closed with absorbable suture    The patient tolerated the procedure without any immediate complications, and was transferred to the PACU in stable condition.  Luccas Towell Bary Leriche

## 2021-03-22 NOTE — Op Note (Signed)
Video Bronchoscopy with Robotic Assisted Bronchoscopic Navigation   Date of Operation: 03/22/2021   Pre-op Diagnosis: Lung nodule  Post-op Diagnosis: Lung nodule  Surgeon: Garner Nash, DO  Assistants: None   Anesthesia: General endotracheal anesthesia  Operation: Flexible video fiberoptic bronchoscopy with robotic assistance and biopsies.  Estimated Blood Loss: Minimal  Complications: None  Indications and History: Brittany Macias is a 50 y.o. female with history of lung nodule. The risks, benefits, complications, treatment options and expected outcomes were discussed with the patient.  The possibilities of pneumothorax, pneumonia, reaction to medication, pulmonary aspiration, perforation of a viscus, bleeding, failure to diagnose a condition and creating a complication requiring transfusion or operation were discussed with the patient who freely signed the consent.    Description of Procedure: The patient was seen in the Preoperative Area, was examined and was deemed appropriate to proceed.  The patient was taken to [facility] endoscopy room 3, identified as [name] and the procedure verified as Flexible Video Fiberoptic Bronchoscopy.  A Time Out was held and the above information confirmed.   Prior to the date of the procedure a high-resolution CT scan of the chest was performed. Utilizing ION software program a virtual tracheobronchial tree was generated to allow the creation of distinct navigation pathways to the patient's parenchymal abnormalities. After being taken to the operating room general anesthesia was initiated and the patient  was orally intubated. The video fiberoptic bronchoscope was introduced via the endotracheal tube and a general inspection was performed which showed normal right and left lung anatomy, aspiration of the bilateral mainstems was completed to remove any remaining secretions. Robotic catheter inserted into patient's endotracheal tube.   Target #1 right lower  lobe: The distinct navigation pathways prepared prior to this procedure were then utilized to navigate to patient's lesion identified on CT scan. The robotic catheter was secured into place and the vision probe was withdrawn.  Lesion location was approximated using fluoroscopy and radial endobronchial ultrasound for peripheral targeting. Under fluoroscopic guidance transbronchial needle brushings, transbronchial needle biopsies, and transbronchial forceps biopsies were performed to be sent for cytology and pathology.  Following tissue sampling a 1 cc of 50% / 50% mixture of methylene blue and ICG was injected into the lesion under fluoroscopic guidance for fiducial dye marking.  At the end of the procedure a general airway inspection was performed and there was no evidence of active bleeding. The bronchoscope was removed.  The patient tolerated the procedure well. There was no significant blood loss and there were no obvious complications. A post-procedural chest x-ray is pending.  Samples Target #1: 1. Transbronchial needle brushings from RLL 2. Transbronchial Wang needle biopsies from RLL 3. Transbronchial forceps biopsies from RLL  Plans:  Patient will be transferred under the care of anesthesia and Dr. Kipp Brood to the operating room 10 for robotic assisted thoracic surgery.  Garner Nash, DO [facility] 03/22/2021 8:51 AM

## 2021-03-22 NOTE — Hospital Course (Addendum)
History of Present Illness:  Brittany Macias is a 50 yo female with history of tobacco abuse, HTN, fatty liver, GERD, and cough.  The patient was referred to Dr. Valeta Harms from her family medicine doctor for a new lung nodule.  She had a lung cancer CT screening in November which showed a new right lower lobe nodule that was 2.3 cm nodule with spiculated margins.  There was also a large 3 cm consolidation that was triangular in shape that was felt to possibly be a separate mass vs atelectasis.  She was evaluated by Dr. Valeta Harms who sent patient for PET CT scan.  This showed the right lower lobe lung nodule to be hypermetabolic.  She was referred to [facility] and evaluated by Dr. Kipp Brood who recommended surgical resection for the mass.  This would be done in combination with a navigation procedure to biopsy lesion prior to proceeding.  The risks and benefits of the procedure were explained to the patient and she was agreeable to proceed.  Hospital Course:  Brittany Macias presented to [facility] on 03/23/2021.  She underwent a Video Bronchoscopy with Robotic Assisted Bronchoscopic Navigation performed by Dr. Valeta Harms.  She tolerated the procedure without difficulty.  Initial pathology results did indicate her lung nodule was cancerous.  She was then taken to the operating room and underwent Robotic Assisted Right Video Assisted Thoracoscopy with Right Lower Lobectomy, Lymph node dissection, and intercostal nerve block.  She tolerated the procedure, was extubated, and taken to the PACU in stable condition.  The patient has done well post operatively.  Her chest tube had a small air leak on POD #1.  CXR was free from pneumothorax.  Her output was moderate and her chest tube was left in place.  Her pain wasn't controlled with Tramadol.  This was discontinued and she was started on Oxycodone for additional relief, which improved her control.  Her CXR remained stable w/o pneumothorax.  Her chest tube remained on water seal w/o  evidence of air leak.  Her chest tube was removed on 03/24/2021.  The patient developed some mild throat irritation thought to be due to ET tube and she was treated with Chloraseptic.  She is ambulating w/o difficulty.  Her surgical incisions are healing w/o evidence of infection.  She is medically stable for discharge home today.

## 2021-03-23 ENCOUNTER — Inpatient Hospital Stay (HOSPITAL_COMMUNITY): Payer: BC Managed Care – PPO

## 2021-03-23 ENCOUNTER — Telehealth: Payer: Self-pay | Admitting: Pulmonary Disease

## 2021-03-23 ENCOUNTER — Encounter (HOSPITAL_COMMUNITY): Payer: Self-pay | Admitting: Thoracic Surgery (Cardiothoracic Vascular Surgery)

## 2021-03-23 LAB — CYTOLOGY - NON PAP

## 2021-03-23 LAB — CBC
HCT: 35.1 % — ABNORMAL LOW (ref 36.0–46.0)
Hemoglobin: 11.5 g/dL — ABNORMAL LOW (ref 12.0–15.0)
MCH: 36.5 pg — ABNORMAL HIGH (ref 26.0–34.0)
MCHC: 32.8 g/dL (ref 30.0–36.0)
MCV: 111.4 fL — ABNORMAL HIGH (ref 80.0–100.0)
Platelets: 318 10*3/uL (ref 150–400)
RBC: 3.15 MIL/uL — ABNORMAL LOW (ref 3.87–5.11)
RDW: 14.6 % (ref 11.5–15.5)
WBC: 18.6 10*3/uL — ABNORMAL HIGH (ref 4.0–10.5)
nRBC: 0 % (ref 0.0–0.2)

## 2021-03-23 LAB — BASIC METABOLIC PANEL
Anion gap: 5 (ref 5–15)
BUN: 11 mg/dL (ref 6–20)
CO2: 27 mmol/L (ref 22–32)
Calcium: 8.7 mg/dL — ABNORMAL LOW (ref 8.9–10.3)
Chloride: 105 mmol/L (ref 98–111)
Creatinine, Ser: 1 mg/dL (ref 0.44–1.00)
GFR, Estimated: >60 mL/min (ref >60)
Glucose, Bld: 108 mg/dL — ABNORMAL HIGH (ref 70–99)
Potassium: 4 mmol/L (ref 3.5–5.1)
Sodium: 137 mmol/L (ref 135–145)

## 2021-03-23 MED ORDER — OXYCODONE HCL 5 MG PO TABS
5.0000 mg | ORAL_TABLET | ORAL | Status: DC | PRN
Start: 2021-03-23 — End: 2021-03-24
  Administered 2021-03-23 (×2): 10 mg via ORAL
  Administered 2021-03-24: 10:00:00 5 mg via ORAL
  Filled 2021-03-23 (×2): qty 2
  Filled 2021-03-23: qty 1

## 2021-03-23 NOTE — Progress Notes (Addendum)
° °   °  [address]       [place] [address]             [phone]      1 Day Post-Op Procedure(s) (LRB): XI ROBOTIC ASSISTED THORASCOPY-RIGHT LOWER LOBECTOMY (Right) INTERCOSTAL NERVE BLOCK (Right)  Subjective:  States nursing tells her she is doing very well.  She has some nausea which is   Objective: Vital signs in last 24 hours: Temp:  [97.7 F (36.5 C)-98.3 F (36.8 C)] 98.2 F (36.8 C) (12/29 0318) Pulse Rate:  [65-92] 70 (12/29 0318) Cardiac Rhythm: Normal sinus rhythm (12/29 0318) Resp:  [12-24] 18 (12/29 0318) BP: (124-163)/(73-101) 124/74 (12/29 0318) SpO2:  [86 %-99 %] 93 % (12/29 0318)  Intake/Output from previous day: 12/28 0701 - 12/29 0700 In: 1650 [I.V.:1400; IV Piggyback:250] Out: 1550 [Urine:900; Blood:100; Chest Tube:550]  General appearance: alert, cooperative, and no distress Heart: regular rate and rhythm Lungs: clear to auscultation bilaterally Abdomen: soft, non-tender; bowel sounds normal; no masses,  no organomegaly Extremities: extremities normal, atraumatic, no cyanosis or edema Wound: clean and dry  Lab Results: Recent Labs    03/21/21 1127 03/23/21 0540  WBC 9.9 18.6*  HGB 12.8 11.5*  HCT 39.4 35.1*  PLT 348 318   BMET:  Recent Labs    03/21/21 1127 03/23/21 0540  NA 137 137  K 4.2 4.0  CL 108 105  CO2 21* 27  GLUCOSE 100* 108*  BUN 13 11  CREATININE 0.85 1.00  CALCIUM 8.8* 8.7*    PT/INR:  Recent Labs    03/21/21 1127  LABPROT 12.6  INR 0.9   ABG    Component Value Date/Time   PHART 7.414 03/21/2021 1151   HCO3 25.2 03/21/2021 1151   O2SAT 97.9 03/21/2021 1151   CBG (last 3)  No results for input(s): GLUCAP in the last 72 hours.  Assessment/Plan: S/P Procedure(s) (LRB): XI ROBOTIC ASSISTED THORASCOPY-RIGHT LOWER LOBECTOMY (Right) INTERCOSTAL NERVE BLOCK (Right)  CV-hemodynamically stable Pulm- initial air leak which cleared after several coughs, CT output was 550 bloody since  surgery, CXR shows no pneumothorax.... will leave chest tube in place today, mainly due to drainage Pain control- not getting much relief with tramadol, will continue Toradol and start Oxycodone Lovenox for DVT prophylaxis Dispo- patient doing well, initial air leak cleared after several coughs, CT output was 550 cc bloody drainage, will leave chest tube today, repeat CXR in AM, stop Tramadol as not working for pain relief, start Oxycodone   LOS: 1 day    Ellwood Handler, PA-C 03/23/2021   Agree with above No obvious leak Ambulation today Possible d/c CT tomorrow  Lajuana Matte

## 2021-03-23 NOTE — Progress Notes (Signed)
°  Transition of Care [facility]) Screening Note   Patient Details  Name: Eviana Sibilia Date of Birth: [birth year]   Transition of Care [facility]) CM/SW Contact:    Pollie Friar, RN Phone Number: 03/23/2021, 2:03 PM    Transition of Care Department [facility]) has reviewed patient. We will continue to monitor patient advancement through interdisciplinary progression rounds. If new patient transition needs arise, please place a TOC consult.

## 2021-03-23 NOTE — Discharge Summary (Signed)
Physician Discharge Summary  Patient ID: Brittany Macias MRN: [id number] DOB/AGE: 06/09/1970 50 y.o.  Admit date: 03/22/2021 Discharge date: 03/24/2021  Admission Diagnoses:  Patient Active Problem List   Diagnosis Date Noted   Lung nodule 03/22/2021   Back pain 03/09/2021   Body mass index (BMI) 32.0-32.9, adult 03/09/2021   Cough 03/09/2021   Essential hypertension 03/09/2021   Gastroesophageal reflux disease without esophagitis 03/09/2021   Other general symptoms and signs 03/09/2021   Other seasonal allergic rhinitis 03/09/2021   Abnormal liver function tests 02/04/2013   Fatty liver 02/04/2013   Discharge Diagnoses:   Patient Active Problem List   Diagnosis Date Noted   Lung nodule 03/22/2021   S/P Robotic Assisted Right Video Assisted Thoracoscopy with Right Lower Lobectomy, Lymph Node Dissection, and Intercostal nerve block 03/22/2021   Back pain 03/09/2021   Body mass index (BMI) 32.0-32.9, adult 03/09/2021   Cough 03/09/2021   Essential hypertension 03/09/2021   Gastroesophageal reflux disease without esophagitis 03/09/2021   Other general symptoms and signs 03/09/2021   Other seasonal allergic rhinitis 03/09/2021   Abnormal liver function tests 02/04/2013   Fatty liver 02/04/2013   Discharged Condition: good  History of Present Illness:  Brittany Macias is a 50 yo female with history of tobacco abuse, HTN, fatty liver, GERD, and cough.  The patient was referred to Dr. Valeta Harms from her family medicine doctor for a new lung nodule.  She had a lung cancer CT screening in November which showed a new right lower lobe nodule that was 2.3 cm nodule with spiculated margins.  There was also a large 3 cm consolidation that was triangular in shape that was felt to possibly be a separate mass vs atelectasis.  She was evaluated by Dr. Valeta Harms who sent patient for PET CT scan.  This showed the right lower lobe lung nodule to be hypermetabolic.  She was referred to [facility] and evaluated by Dr.  Kipp Brood who recommended surgical resection for the mass.  This would be done in combination with a navigation procedure to biopsy lesion prior to proceeding.  The risks and benefits of the procedure were explained to the patient and she was agreeable to proceed.  Hospital Course:  [name] presented to [facility] on 03/23/2021.  She underwent a Video Bronchoscopy with Robotic Assisted Bronchoscopic Navigation performed by Dr. Valeta Harms.  She tolerated the procedure without difficulty.  Initial pathology results did indicate her lung nodule was cancerous.  She was then taken to the operating room and underwent Robotic Assisted Right Video Assisted Thoracoscopy with Right Lower Lobectomy, Lymph node dissection, and intercostal nerve block.  She tolerated the procedure, was extubated, and taken to the PACU in stable condition.  The patient has done well post operatively.  Her chest tube had a small air leak on POD #1.  CXR was free from pneumothorax.  Her output was moderate and her chest tube was left in place.  Her pain wasn't controlled with Tramadol.  This was discontinued and she was started on Oxycodone for additional relief, which improved her control.  Her CXR remained stable w/o pneumothorax.  Her chest tube remained on water seal w/o evidence of air leak.  Her chest tube was removed on 03/24/2021.  The patient developed some mild throat irritation thought to be due to ET tube and she was treated with Chloraseptic.  She is ambulating w/o difficulty.  Her surgical incisions are healing w/o evidence of infection.  She is medically stable for discharge home  today.  Consults: pulmonary/intensive care  Significant Diagnostic Studies: nuclear medicine:   CHEST: Linear opacity of the right lower lobe with central hypermetabolic activity measuring approximately 1.3 x 1.1 cm, SUV max of 6.6. more distal pleural lesion demonstrates low level FDG uptake which is slightly above mediastinal blood  pool, 3.2. Symmetric hypermetabolic activity of the bilateral neck bases and paracervical soft tissues with no associated soft tissue abnormality, findings are favored to be due to brown fat. No hypermetabolic lymph nodes seen in the chest.   Incidental CT findings: Upper lobe predominant paraseptal emphysema. Mild bilateral air trapping. Mild calcified plaque of the thoracic aorta.  IMPRESSION: 1. Linear opacity of the right lower lobe with central hypermetabolic activity, findings are concerning for primary lung malignancy with associated postobstructive changes. 2. No evidence of FDG avid metastatic disease in the chest, abdomen or pelvis. 3. Bilateral neck base and paraspinal FDG uptake with no associated soft tissue abnormality, likely due to brown fat.     Electronically Signed   By: Yetta Glassman M.D.   On: 02/24/2021 16:14   Treatments: surgery:   03/22/2021   Patient:  Brittany Macias Pre-Op Dx: right lower lobe pulmonary nodule   Post-op Dx:  right lower lobe carcinoma Procedure: - Robotic assisted right video thoracoscopy - Right lower lobectomy - Mediastinal lymph node sampling - Intercostal nerve block   Surgeon and Role:      * Lightfoot, Lucile Crater, MD - Primary   Assistant: Leretha Pol, PA-C  An experienced assistant was required given the complexity of this surgery and the standard of surgical care. The assistant was needed for exposure, dissection, suctioning, retraction of delicate tissues and sutures, instrument exchange and for overall help during this procedure.     Video Bronchoscopy with Robotic Assisted Bronchoscopic Navigation    Date of Operation: 03/22/2021    Pre-op Diagnosis: Lung nodule   Post-op Diagnosis: Lung nodule   Surgeon: Garner Nash, DO    CYTOLOGY:  NON PAP  CASE: [id number]  PATIENT: Brittany Macias  Non-Gynecological Cytology Report   Clinical History: Lung nodule   FINAL MICROSCOPIC DIAGNOSIS:  A. LUNG, RLL,  BRUSHING:  - Atypical cells present  B. LUNG, RLL, FINE NEEDLE ASPIRATION:  - Malignant cells present  SPECIMEN ADEQUACY:  A. Satisfactory for Evaluation  B. Satisfactory for Evaluation  IMMEDIATE EVALUATION:  B2, RLL  NEEDLE  MALIGNANT CELLS CONSISTENT WITH CARCINOMA  (MSJ)   PATHOLOGY:    Discharge Exam: Blood pressure 119/70, pulse 77, temperature 98.1 F (36.7 C), resp. rate 15, height 5\' 3"  (1.6 m), weight 181 lb (82.1 kg), SpO2 98 %.  General appearance: alert, cooperative, and no distress Heart: regular rate and rhythm Lungs: clear to auscultation bilaterally Abdomen: soft, non-tender; bowel sounds normal; no masses,  no organomegaly Extremities: extremities normal, atraumatic, no cyanosis or edema Wound: clean and dry  Discharge disposition: 01-Home or Self Care   Allergies as of 03/24/2021       Reactions   Erythromycin Anaphylaxis   Sulfa Antibiotics Swelling, Rash        Medication List     STOP taking these medications    predniSONE 5 MG tablet Commonly known as: DELTASONE       TAKE these medications    acetaminophen 500 MG tablet Commonly known as: TYLENOL Take 2 tablets (1,000 mg total) by mouth every 6 (six) hours as needed.   albuterol 108 (90 Base) MCG/ACT inhaler Commonly known as: VENTOLIN HFA Inhale 2 puffs  into the lungs every 6 (six) hours as needed for wheezing or shortness of breath.   ALPRAZolam 0.25 MG tablet Commonly known as: XANAX Take 0.25 mg by mouth 2 (two) times daily as needed for anxiety.   cholecalciferol 25 MCG (1000 UNIT) tablet Commonly known as: VITAMIN D3 Take 1,000 Units by mouth daily.   ELDERBERRY PO Take 1 tablet by mouth daily.   FLUoxetine 10 MG tablet Commonly known as: PROZAC Take 10 mg by mouth at bedtime.   levothyroxine 100 MCG tablet Commonly known as: SYNTHROID Take 100 mcg by mouth daily before breakfast.   oxyCODONE 5 MG immediate release tablet Commonly known as: Oxy  IR/ROXICODONE Take 1 tablet (5 mg total) by mouth every 4 (four) hours as needed for severe pain.   Spiriva Respimat 1.25 MCG/ACT Aers Generic drug: Tiotropium Bromide Monohydrate Inhale 2 puffs into the lungs daily.   tetrahydrozoline-zinc 0.05-0.25 % ophthalmic solution Commonly known as: VISINE-AC Place 2 drops into both eyes 3 (three) times daily as needed (dry eyes).   vitamin C 500 MG tablet Commonly known as: ASCORBIC ACID Take 500 mg by mouth daily.        Follow-up Information     Icard, Bradley L, DO. Go to.   Specialty: Pulmonary Disease Contact information: [address] [place] [address] [phone]         Lajuana Matte, MD Follow up on 03/31/2021.   Specialty: Cardiothoracic Surgery Why: Appointment is at 1:50 Contact information: [address] [place] [address] [phone]                 Signed: Ellwood Handler, PA-C  03/24/2021, 1:46 PM

## 2021-03-23 NOTE — Telephone Encounter (Signed)
I called and spoke with Bonnita Nasuti and she reports that [employer] would need a copy of the reports for her benefits once the reports have been completed.   The fax number is [phone]. This is for the [employer] benefits for this patient. This will need to be completed once all the reports are done.

## 2021-03-24 ENCOUNTER — Inpatient Hospital Stay (HOSPITAL_COMMUNITY): Payer: BC Managed Care – PPO

## 2021-03-24 LAB — COMPREHENSIVE METABOLIC PANEL
ALT: 15 U/L (ref 0–44)
AST: 24 U/L (ref 15–41)
Albumin: 2.9 g/dL — ABNORMAL LOW (ref 3.5–5.0)
Alkaline Phosphatase: 63 U/L (ref 38–126)
Anion gap: 8 (ref 5–15)
BUN: 18 mg/dL (ref 6–20)
CO2: 27 mmol/L (ref 22–32)
Calcium: 8.6 mg/dL — ABNORMAL LOW (ref 8.9–10.3)
Chloride: 104 mmol/L (ref 98–111)
Creatinine, Ser: 1.07 mg/dL — ABNORMAL HIGH (ref 0.44–1.00)
Glucose, Bld: 116 mg/dL — ABNORMAL HIGH (ref 70–99)
Potassium: 3.9 mmol/L (ref 3.5–5.1)
Sodium: 139 mmol/L (ref 135–145)
Total Bilirubin: 0.6 mg/dL (ref 0.3–1.2)
Total Protein: 5.5 g/dL — ABNORMAL LOW (ref 6.5–8.1)

## 2021-03-24 LAB — CBC
HCT: 33.5 % — ABNORMAL LOW (ref 36.0–46.0)
Hemoglobin: 10.6 g/dL — ABNORMAL LOW (ref 12.0–15.0)
MCH: 35.6 pg — ABNORMAL HIGH (ref 26.0–34.0)
MCHC: 31.6 g/dL (ref 30.0–36.0)
MCV: 112.4 fL — ABNORMAL HIGH (ref 80.0–100.0)
Platelets: 308 10*3/uL (ref 150–400)
RBC: 2.98 MIL/uL — ABNORMAL LOW (ref 3.87–5.11)
RDW: 14.6 % (ref 11.5–15.5)
WBC: 13.6 10*3/uL — ABNORMAL HIGH (ref 4.0–10.5)
nRBC: 0 % (ref 0.0–0.2)

## 2021-03-24 MED ORDER — OXYCODONE HCL 5 MG PO TABS: 5.0000 mg | ORAL_TABLET | ORAL | 0 refills | Status: DC | PRN

## 2021-03-24 MED ORDER — PHENOL 1.4 % MT LIQD: 1.0000 | OROMUCOSAL | Status: DC | PRN

## 2021-03-24 MED ORDER — ACETAMINOPHEN 500 MG PO TABS: 1000.0000 mg | ORAL_TABLET | Freq: Four times a day (QID) | ORAL | 0 refills | Status: AC | PRN

## 2021-03-24 NOTE — Plan of Care (Signed)

## 2021-03-24 NOTE — Progress Notes (Addendum)
° °   °  Brittany Macias [address]       [place] [address]             [phone]      2 Days Post-Op Procedure(s) (LRB): XI ROBOTIC ASSISTED THORASCOPY-RIGHT LOWER LOBECTOMY (Right) INTERCOSTAL NERVE BLOCK (Right)  Subjective:  Patient with some throat irritation.  Also state below her tongue is numb.  Pain control improved with Oxycodone.  Objective: Vital signs in last 24 hours: Temp:  [97.9 F (36.6 C)-98.5 F (36.9 C)] 98.5 F (36.9 C) (12/30 0425) Pulse Rate:  [66-77] 77 (12/30 0425) Cardiac Rhythm: Normal sinus rhythm (12/29 1914) Resp:  [16-18] 16 (12/30 0425) BP: (108-122)/(66-91) 112/80 (12/30 0425) SpO2:  [94 %-98 %] 98 % (12/30 0425)  Intake/Output from previous day: 12/29 0701 - 12/30 0700 In: 720 [P.O.:720] Out: 300 [Chest Tube:300]  General appearance: alert, cooperative, and no distress Heart: regular rate and rhythm Lungs: clear to auscultation bilaterally Abdomen: soft, non-tender; bowel sounds normal; no masses,  no organomegaly Extremities: extremities normal, atraumatic, no cyanosis or edema Wound: clean and dry  Lab Results: Recent Labs    03/23/21 0540 03/24/21 0038  WBC 18.6* 13.6*  HGB 11.5* 10.6*  HCT 35.1* 33.5*  PLT 318 308   BMET:  Recent Labs    03/23/21 0540 03/24/21 0038  NA 137 139  K 4.0 3.9  CL 105 104  CO2 27 27  GLUCOSE 108* 116*  BUN 11 18  CREATININE 1.00 1.07*  CALCIUM 8.7* 8.6*    PT/INR:  Recent Labs    03/21/21 1127  LABPROT 12.6  INR 0.9   ABG    Component Value Date/Time   PHART 7.414 03/21/2021 1151   HCO3 25.2 03/21/2021 1151   O2SAT 97.9 03/21/2021 1151   CBG (last 3)  No results for input(s): GLUCAP in the last 72 hours.  Assessment/Plan: S/P Procedure(s) (LRB): XI ROBOTIC ASSISTED THORASCOPY-RIGHT LOWER LOBECTOMY (Right) INTERCOSTAL NERVE BLOCK (Right)  CV- hemodynamically stable Pulm- CXR w/o pneumothorax, CT on water seal no air leak present.Marland Kitchen output was 300 cc yesterday, will  likely d/c chest tube today Pain control- improved with oxycodone ENT- no evidence of thrush, chloraseptic for minor throat irritation due to ET tube Lovenox for DVT prophylaxis Dispo- patient stable, no air leak, will likely d/c chest tube and d/c home today   LOS: 2 days    Ellwood Handler, PA-C 03/24/2021

## 2021-03-25 ENCOUNTER — Encounter (HOSPITAL_COMMUNITY): Payer: Self-pay | Admitting: Certified Registered"

## 2021-03-26 ENCOUNTER — Encounter (HOSPITAL_COMMUNITY): Payer: Self-pay | Admitting: Pulmonary Disease

## 2021-03-28 ENCOUNTER — Telehealth: Payer: Self-pay | Admitting: Pulmonary Disease

## 2021-03-29 LAB — SURGICAL PATHOLOGY

## 2021-03-29 NOTE — Telephone Encounter (Signed)
Duplicate message. Will close this encounter.

## 2021-03-30 ENCOUNTER — Other Ambulatory Visit: Payer: Self-pay | Admitting: *Deleted

## 2021-03-30 NOTE — Progress Notes (Signed)
The proposed treatment discussed in conference is for discussion purpose only and is not a binding recommendation. The patient was not been physically examined, or presented with their treatment options. Therefore, final treatment plans cannot be decided.  

## 2021-03-31 ENCOUNTER — Ambulatory Visit (INDEPENDENT_AMBULATORY_CARE_PROVIDER_SITE_OTHER): Payer: Self-pay | Admitting: Thoracic Surgery (Cardiothoracic Vascular Surgery)

## 2021-03-31 ENCOUNTER — Other Ambulatory Visit: Payer: Self-pay

## 2021-03-31 VITALS — BP 147/87 | HR 70 | Resp 20 | Ht 63.0 in | Wt 178.0 lb

## 2021-03-31 DIAGNOSIS — Z09 Encounter for follow-up examination after completed treatment for conditions other than malignant neoplasm: Secondary | ICD-10-CM

## 2021-03-31 MED ORDER — OXYCODONE HCL 5 MG PO TABS: 5.0000 mg | ORAL_TABLET | ORAL | 0 refills | Status: DC | PRN

## 2021-03-31 NOTE — Progress Notes (Signed)
[address]       [place] [address]             [phone]        Jeily Nieland [place] Medical Record #[id number] Date of Birth: 1970-12-16  Referring: Garner Nash, DO Primary Care: Marjo Bicker, MD Primary Cardiologist:None  Reason for visit:   follow-up  History of Present Illness:     51 year old female presents for her 1 week follow-up appointment.  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  She continues to have some pain, however she has not been taking her pain medication.  Physical Exam: BP (!) 147/87    Pulse 70    Resp 20    Ht 5\' 3"  (1.6 m)    Wt 178 lb (80.7 kg)    SpO2 96% Comment: RA   BMI 31.53 kg/m   Alert NAD Incision clean, stitch removed.  Abdomen  ND No peripheral edema   Diagnostic Studies & Laboratory data:  Path: FINAL MICROSCOPIC DIAGNOSIS:   A. LUNG, RIGHT LOWER LOBE, LOBECTOMY:  - Poorly differentiated adenocarcinoma, 2.5 cm  - Resection margins are not involved  - Visceral pleura is not involved  - Seven lymph nodes, negative for metastatic carcinoma (0/7)  - See oncology table   B. LYMPH NODE, HILAR, EXCISION:  - Lymph node, negative for carcinoma (0/1)   C. LYMPH NODE, LEVEL 7, EXCISION:  - Lymph node, negative for carcinoma (0/1)   D. LYMPH NODE, LEVEL 7 #2, EXCISION:  - Lymph node, negative for carcinoma (0/1)   E. LYMPH NODE, HILAR #2, EXCISION:  - Lymph node, negative for carcinoma (0/1)   F. LYMPH NODE, LEVEL 7 #3, EXCISION:  - Lymph node, negative for carcinoma (0/1)   G. LYMPH NODE, HILAR #3, EXCISION:  - Lymph node, negative for carcinoma (0/1)      COMMENT:   A.  Immunohistochemical stains show the tumor cells are positive for  TTF-1, MOC-31 and CK7, whereas they are negative for p40 and CK5/6,  consistent with the above diagnosis.     ONCOLOGY TABLE:   LUNG: Resection   Synchronous Tumors: Not  applicable  Total Number of Primary Tumors: 1  Procedure: Lung lobectomy  Specimen Laterality: Right lower lobe  Tumor Focality: Unifocal  Tumor Site: Right lower lung lobe  Tumor Size: 2.5 x 1.1 x 1 cm  Histologic Type: Adenocarcinoma, poorly differentiated  Visceral Pleura Invasion: Not identified  Direct Invasion of Adjacent Structures: Not identified  Lymphovascular Invasion: Not identified  Margins: All surgical margins negative for invasive carcinoma  Regional Lymph Nodes:       Number of Lymph Nodes Involved: 0                            Nodal Sites with Tumor: 0       Number of Lymph Nodes Examined: 13                       Nodal Sites Examined: 2, 3, 7 and peribronchial  Distant Metastasis:       Distant Site(s) Involved: Not applicable  Pathologic Stage Classification (pTNM, AJCC 8th Edition): pT1c, pN0    Assessment / Plan:   51 year old female status post robotic assisted right lower lobectomy for T1 cN0 M0 stage I adenocarcinoma.  Overall she is  doing well.  She will follow-up in 1 with a chest x-ray.   Lajuana Matte 03/31/2021 9:12 PM

## 2021-04-11 ENCOUNTER — Other Ambulatory Visit: Payer: Self-pay | Admitting: *Deleted

## 2021-04-11 MED ORDER — TRAMADOL HCL 50 MG PO TABS
50.0000 mg | ORAL_TABLET | Freq: Four times a day (QID) | ORAL | 0 refills | Status: DC | PRN
Start: 2021-04-11 — End: 2021-04-28

## 2021-04-11 MED ORDER — ONDANSETRON HCL 4 MG PO TABS: 4.0000 mg | ORAL_TABLET | Freq: Two times a day (BID) | ORAL | 0 refills | Status: AC | PRN

## 2021-04-11 NOTE — Progress Notes (Signed)
Ms. Brittany Macias contacted the office requesting a refill of oxycodone. Per patient, she has began to wean herself off of the medication but continues to take two pills daily as well as ibuprofen. Patient also c/o nausea after eating. States her appetite has increased and she is experiencing occasional nausea after eating. Patient states she is taking medication with food on her stomach. Patient reports passing gas and having bowel movement. Per Evonnie Pat, PA, prescription for Tramadol and Zofran sent into patient's preferred pharmacy. Patient advised if nausea continues to contact PCP to further discuss symptom. Patient verbalizes understanding.

## 2021-04-13 ENCOUNTER — Telehealth: Payer: Self-pay | Admitting: Pulmonary Disease

## 2021-04-13 NOTE — Telephone Encounter (Signed)
Final pathology report faxed to pts insurance company.

## 2021-04-13 NOTE — Telephone Encounter (Signed)
Called and spoke to patient, she states her insurance company needs final cytology/pathology report  Claim number is [id number] and fax is [phone].   Patient states she has procedure done on 03-22-2021 and has not heard from anyone regarding report as to if she has cancer or what kind of cancer it is.   Would like to hear from Dr. Valeta Harms today or early tomorrow if possible.   Dr. Valeta Harms please advise, will route as a high priority message.

## 2021-04-27 ENCOUNTER — Other Ambulatory Visit: Payer: Self-pay | Admitting: Thoracic Surgery (Cardiothoracic Vascular Surgery)

## 2021-04-27 DIAGNOSIS — R911 Solitary pulmonary nodule: Secondary | ICD-10-CM

## 2021-04-28 ENCOUNTER — Ambulatory Visit (INDEPENDENT_AMBULATORY_CARE_PROVIDER_SITE_OTHER): Payer: Self-pay | Admitting: Thoracic Surgery (Cardiothoracic Vascular Surgery)

## 2021-04-28 ENCOUNTER — Telehealth: Payer: Self-pay | Admitting: Physician Assistant

## 2021-04-28 ENCOUNTER — Other Ambulatory Visit: Payer: Self-pay

## 2021-04-28 ENCOUNTER — Ambulatory Visit
Admission: RE | Admit: 2021-04-28 | Discharge: 2021-04-28 | Disposition: A | Discharge status: Home / Self Care | Payer: BC Managed Care – PPO | Source: Ambulatory Visit | Attending: Thoracic Surgery (Cardiothoracic Vascular Surgery) | Admitting: Thoracic Surgery (Cardiothoracic Vascular Surgery)

## 2021-04-28 VITALS — BP 139/96 | HR 66 | Resp 20 | Ht 63.0 in | Wt 172.0 lb

## 2021-04-28 DIAGNOSIS — Z09 Encounter for follow-up examination after completed treatment for conditions other than malignant neoplasm: Secondary | ICD-10-CM

## 2021-04-28 DIAGNOSIS — R911 Solitary pulmonary nodule: Secondary | ICD-10-CM

## 2021-04-28 DIAGNOSIS — Z736 Limitation of activities due to disability: Secondary | ICD-10-CM

## 2021-04-28 MED ORDER — TRAMADOL HCL 50 MG PO TABS: 50.0000 mg | ORAL_TABLET | Freq: Four times a day (QID) | ORAL | 0 refills | Status: DC | PRN

## 2021-04-28 MED ORDER — PREGABALIN 25 MG PO CAPS: 25.0000 mg | ORAL_CAPSULE | Freq: Two times a day (BID) | ORAL | 0 refills | Status: DC

## 2021-04-28 NOTE — Progress Notes (Signed)
° °   °  [address]       [place] [address]             [phone]        Saran Lipinski [place] Medical Record #[id number] Date of Birth: 07/25/1970  Referring: Garner Nash, DO Primary Care: Marjo Bicker, MD Primary Cardiologist:None  Reason for visit:   follow-up  History of Present Illness:     51 year old female presents for 1 month follow-up appointment.  She still complains of incisional pain and abdominal bloating.  She denies any shortness of breath.  Physical Exam: BP (!) 139/96 (BP Location: Left Arm, Patient Position: Sitting)    Pulse 66    Resp 20    Ht 5\' 3"  (1.6 m)    Wt 172 lb (78 kg)    SpO2 100% Comment: RA   BMI 30.47 kg/m   Alert NAD Incision clean.   Abdomen soft, ND No peripheral edema   Diagnostic Studies & Laboratory data: CXR: Clear     Assessment / Plan:   51 year old female status post robotic assisted right lower lobectomy for a stage I adenocarcinoma.  She continues to do well.  Her chest x-ray is clear.  She will meet with medical oncology for ongoing surveillance.  She will follow-up with [place] as needed.   Lajuana Matte 04/28/2021 2:23 PM

## 2021-04-28 NOTE — Telephone Encounter (Signed)
Scheduled appt per 2/3 referral. Pt is aware of appt date and time. Pt is aware to arrive 15 mins prior to appt time.

## 2021-05-12 ENCOUNTER — Other Ambulatory Visit: Payer: Self-pay

## 2021-05-12 DIAGNOSIS — R911 Solitary pulmonary nodule: Secondary | ICD-10-CM

## 2021-05-12 NOTE — Progress Notes (Signed)
[facility] [phone]   Fax:([phone]) [phone]  CONSULT NOTE  REFERRING PHYSICIAN: Dr. Kipp Brood  REASON FOR CONSULTATION:  Stage Ia adenocarcinoma  HPI Brittany Macias is a 51 y.o. female with a past medical history significant for hypertension, seasonal allergies, and fatty liver disease is referred to the clinic to establish care for recently diagnosed stage Ia adenocarcinoma.  The patient had a low-dose lung cancer screening performed on 02/13/2021.  The scan showed a right lower lobe 2.3 cm nodule with spiculated margins.  Just distal to that, there was a larger 3 cm consolidation that is triangular in shape representing possible a separate mass versus atelectasis.  She was referred to pulmonary medicine, Dr. Valeta Harms, who arranged for a PET scan on 02/23/2021.  This showed near opacity of the right lower lobe with central hypermetabolic activity and the findings were concerning for primary lung malignancy with associated postobstructive changes.  There is no FDG avid metastatic disease in the chest, abdomen, or pelvis.  The patient then saw Dr. Kipp Brood from cardiothoracic surgery.  Dr. Kipp Brood performed a right lower lobectomy with lymph node dissection on 03/22/2022.  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  0 out of 7 lymph nodes were involved.  The patient was referred to clinic to establish care.   Overall, the patient is feeling fairly well today. 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 She is taking lyrica. She also has some discomfort if she lays on her right side.  She denies any fever and chills.  She reported some weight loss after her surgery.  She estimates she lost 13 pounds.  She is currently drinking Ensure 1-2 times per day and her appetite is coming back.  She denies any nausea, vomiting, diarrhea, or constipation.  She denies any headache or visual changes.  She denies any  cough, hemoptysis, or shortness of breath.  The patient's family history consists of a mother with dementia, blood clots, stomach cancer, and hypothyroidism.  The patient's father passed away secondary to throat cancer.  He also had cirrhosis.  The patient has a sister with hypertension and juvenile diabetes.  The patient works as a Psychologist, sport and exercise.  She is divorced and has 4 children.  The patient smoked approximately 35 years averaging less than 1 pack of cigarettes per day.  She quit smoking prior to her surgery.  The patient denies drug use.  She estimates that she drinks 1 alcoholic beverage per day.   HPI  Past Medical History:  Diagnosis Date   Anemia    Anxiety    Cancer ([place])    Depression    Former smoker    Pneumonia    PONV (postoperative nausea and vomiting)     Past Surgical History:  Procedure Laterality Date   BRONCHIAL BIOPSY  03/22/2021   Procedure: BRONCHIAL BIOPSIES;  Surgeon: Garner Nash, DO;  Location: [facility];  Service: Pulmonary;;   BRONCHIAL BRUSHINGS  03/22/2021   Procedure: BRONCHIAL BRUSHINGS;  Surgeon: Garner Nash, DO;  Location: [facility];  Service: Pulmonary;;   BRONCHIAL NEEDLE ASPIRATION BIOPSY  03/22/2021   Procedure: BRONCHIAL NEEDLE ASPIRATION BIOPSIES;  Surgeon: Garner Nash, DO;  Location: [facility];  Service: Pulmonary;;   CAROTID ENDARTERECTOMY Right 2007   FIDUCIAL MARKER PLACEMENT  03/22/2021   Procedure: FIDUCIAL DYE MARKING;  Surgeon: Garner Nash, DO;  Location: [facility];  Service: Pulmonary;;   INTERCOSTAL NERVE BLOCK Right  03/22/2021   Procedure: INTERCOSTAL NERVE BLOCK;  Surgeon: Lajuana Matte, MD;  Location: [facility];  Service: Thoracic;  Laterality: Right;   PARTIAL HYSTERECTOMY     THYROIDECTOMY     VIDEO BRONCHOSCOPY WITH RADIAL ENDOBRONCHIAL ULTRASOUND  03/22/2021   Procedure: RADIAL ENDOBRONCHIAL ULTRASOUND;  Surgeon: Garner Nash, DO;  Location: [facility];  Service: Pulmonary;;     Family History  Problem Relation Age of Onset   Gastric cancer Mother    Lymphoma Sister     Social History Social History   Tobacco Use   Smoking status: Former    Types: Cigarettes    Quit date: 01/2021    Years since quitting: 0.3   Smokeless tobacco: Never  Vaping Use   Vaping Use: Some days   Substances: CBD  Substance Use Topics   Alcohol use: Yes    Comment: social   Drug use: Yes    Types: Marijuana    Comment: Smokes marijuana 2-3 times per week    Allergies  Allergen Reactions   Erythromycin Anaphylaxis   Sulfa Antibiotics Swelling and Rash    Current Outpatient Medications  Medication Sig Dispense Refill   acetaminophen (TYLENOL) 500 MG tablet Take 2 tablets (1,000 mg total) by mouth every 6 (six) hours as needed. 30 tablet 0   albuterol (VENTOLIN HFA) 108 (90 Base) MCG/ACT inhaler Inhale 2 puffs into the lungs every 6 (six) hours as needed for wheezing or shortness of breath. 8 g 6   ALPRAZolam (XANAX) 0.25 MG tablet Take 0.25 mg by mouth 2 (two) times daily as needed for anxiety.     cholecalciferol (VITAMIN D3) 25 MCG (1000 UNIT) tablet Take 1,000 Units by mouth daily.     ELDERBERRY PO Take 1 tablet by mouth daily.     FLUoxetine (PROZAC) 10 MG tablet Take 10 mg by mouth at bedtime.     levothyroxine (SYNTHROID) 100 MCG tablet Take 100 mcg by mouth daily before breakfast.     ondansetron (ZOFRAN) 4 MG tablet Take 1 tablet (4 mg total) by mouth 2 (two) times daily as needed for nausea or vomiting. 10 tablet 0   pregabalin (LYRICA) 25 MG capsule Take 1 capsule (25 mg total) by mouth 2 (two) times daily. 60 capsule 0   tetrahydrozoline-zinc (VISINE-AC) 0.05-0.25 % ophthalmic solution Place 2 drops into both eyes 3 (three) times daily as needed (dry eyes).     Tiotropium Bromide Monohydrate (SPIRIVA RESPIMAT) 1.25 MCG/ACT AERS Inhale 2 puffs into the lungs daily. 4 g 0   traMADol (ULTRAM) 50 MG tablet Take 1 tablet (50 mg total) by mouth every 6 (six)  hours as needed. 30 tablet 0   vitamin C (ASCORBIC ACID) 500 MG tablet Take 500 mg by mouth daily.     No current facility-administered medications for this visit.    REVIEW OF SYSTEMS:   Review of Systems  Constitutional: Positive for decreased appetite weight loss.  Negative for chills, fatigue, and fever. HENT: Negative for mouth sores, nosebleeds, sore throat and trouble swallowing.   Eyes: Negative for eye problems and icterus.  Respiratory: Negative for cough, hemoptysis, shortness of breath and wheezing.   Cardiovascular: Positive for postsurgical pain and right rib. Negative for chest pain and leg swelling.  Gastrointestinal: Negative for abdominal pain, constipation, diarrhea, nausea and vomiting.  Genitourinary: Negative for bladder incontinence, difficulty urinating, dysuria, frequency and hematuria.   Musculoskeletal: Negative for back pain, gait problem, neck pain and neck stiffness.  Skin: Negative  for itching and rash.  Neurological: Negative for dizziness, extremity weakness, gait problem, headaches, light-headedness and seizures.  Hematological: Negative for adenopathy. Does not bruise/bleed easily.  Psychiatric/Behavioral: Negative for confusion, depression and sleep disturbance. The patient is not nervous/anxious.     PHYSICAL EXAMINATION:  Blood pressure (!) 148/103, pulse 75, resp. rate 18, weight 172 lb 7 oz (78.2 kg), SpO2 99 %.  ECOG PERFORMANCE STATUS: 1  Physical Exam  Constitutional: Oriented to person, place, and time and well-developed, well-nourished, and in no distress. HENT:  Head: Normocephalic and atraumatic.  Mouth/Throat: Oropharynx is clear and moist. No oropharyngeal exudate.  Eyes: Conjunctivae are normal. Right eye exhibits no discharge. Left eye exhibits no discharge. No scleral icterus.  Neck: Normal range of motion. Neck supple.  Cardiovascular: Normal rate, regular rhythm, normal heart sounds and intact distal pulses.   Pulmonary/Chest:  Effort normal and breath sounds normal. No respiratory distress. No wheezes. No rales.  Tenderness over right ribs. Abdominal: Soft. Bowel sounds are normal. Exhibits no distension and no mass. There is no tenderness.  Musculoskeletal: Normal range of motion. Exhibits no edema.  Lymphadenopathy:    No cervical adenopathy.  Neurological: Alert and oriented to person, place, and time. Exhibits normal muscle tone. Gait normal. Coordination normal.  Skin: Skin is warm and dry. No rash noted. Not diaphoretic. No erythema. No pallor.  Psychiatric: Mood, memory and judgment normal.  Vitals reviewed.  LABORATORY DATA: Lab Results  Component Value Date   WBC 10.1 05/15/2021   HGB 12.6 05/15/2021   HCT 38.6 05/15/2021   MCV 109.0 (H) 05/15/2021   PLT 478 (H) 05/15/2021      Chemistry      Component Value Date/Time   NA 142 05/15/2021 0847   K 4.2 05/15/2021 0847   CL 106 05/15/2021 0847   CO2 31 05/15/2021 0847   BUN 11 05/15/2021 0847   CREATININE 0.81 05/15/2021 0847      Component Value Date/Time   CALCIUM 9.3 05/15/2021 0847   ALKPHOS 95 05/15/2021 0847   AST 15 05/15/2021 0847   ALT 12 05/15/2021 0847   BILITOT 0.4 05/15/2021 0847       RADIOGRAPHIC STUDIES: DG Chest 2 View  Result Date: 04/28/2021 CLINICAL DATA:  Status post resection of lung nodule in the right lung base, 6 weeks ago. EXAM: CHEST - 2 VIEW COMPARISON:  Chest radiograph dated March 24, 2021 FINDINGS: The heart size and mediastinal contours are within normal limits. Linear atelectasis/postsurgical changes in the right lower lobe with mild elevation of the right hemidiaphragm. Lungs are otherwise clear. No pleural effusion or pneumothorax. Interval removal of the right-sided chest tube. The visualized skeletal structures are unremarkable. IMPRESSION: No active cardiopulmonary disease. Electronically Signed   By: Keane Police D.O.   On: 04/28/2021 10:26    ASSESSMENT: This is a very pleasant 51 year old  African-American female referred to the clinic for stage Ia (T1c , N0 , M0 ) adenocarcinoma.  She is status post right lower lobectomy under the care of Dr. Kipp Brood which was performed on 03/22/2021.  The patient was seen with Dr. Julien Nordmann today.  Dr. Julien Nordmann discussed that there is no recommended adjuvant treatment for stage Ia.  Dr. Julien Nordmann recommends that the patient continue on observation with a restaging CT scan of the chest in 6 months.  We will see her back for follow-up visit at that time for evaluation to review her scan results.  Strongly encouraged the patient to continue with smoking cessation.  She is currently using nicotine replacement therapy.  Discussed that we would not recommend vaping as an alternative to smoking cigarettes.  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  She will continue drinking Ensure 1-2 times per day.  The patient wishes to have her restaging CT scan at [facility].  I have placed the order at [facility] long in order for it to show up on the radiology scheduling to work you and have written a comment in the comment section to have this performed at [facility].  Discussed with the patient to ensure that she has her scan a few days prior to her follow-up visit in 6 months.  She was given the number to radiology scheduling in the event that she needs to call them and schedule this herself.  The patient voices understanding of current disease status and treatment options and is in agreement with the current care plan.  All questions were answered. The patient knows to call the clinic with any problems, questions or concerns. We can certainly see the patient much sooner if necessary.  Thank you so much for allowing me to participate in the care of Brittany Macias. I will continue to follow up the patient with you and assist in her care.   Disclaimer: This note was dictated with voice recognition software. Similar sounding words can inadvertently  be transcribed and may not be corrected upon review.  Sanaiyah Kirchhoff L Esparto Cohick May 15, 2021, 10:12 AM  ADDENDUM: Hematology/Oncology Attending: I had a face-to-face encounter with the patient today.  I reviewed her record, lab, scan and recommended her care plan.  This is a very pleasant 51 years old African-American female with history of hypertension as well as seasonal allergy and hepatic steatosis.  The patient was seen by her primary care physician and because of her smoking history that she underwent CT screening of the chest on February 13, 2021 and that showed right lower lobe 2.3 cm nodule with a spiculated margin and distal to it there was a larger 0.0 cm consolidation triangular in shape suspicious for a separate mass versus atelectasis.  The patient was seen by Dr. Valeta Harms and she had a PET scan performed on February 23, 2021 and that showed opacity of the right lower lobe with central hypermetabolic activity and the finding were concerning for primary lung malignancy with associated postobstructive changes.  There was no evidence for hypermetabolic activity in the mediastinal lymph node or distant metastatic disease in the abdomen or pelvis.  The patient was referred to Dr. 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  The dissected lymph nodes were negative for malignancy.  There was no evidence for visceral pleural or lymphovascular invasion. The patient is recovering well from her surgery except for the soreness on the right side of the chest from the surgical scar. I had a lengthy discussion with the patient today about her current condition and treatment options. I explained to the patient that she has a stage Ia (T1c, N0, M0) non-small cell lung cancer, adenocarcinoma status post right lower lobectomy with lymph node dissection on  March 22, 2021 under the care of Dr. Kipp Brood. .I explained to the patient that she had curative treatment for her condition with the surgical resection. I also explained to the patient that there is no survival benefit for adjuvant systemic chemotherapy for patient with  a stage Ia non-small cell lung cancer and the current standard of care is observation and close monitoring. I recommended for the patient to have repeat CT scan of the chest in 6 months for restaging of her disease. I also strongly encouraged the patient to continue with the smoking cessation. She was advised to call immediately if she has any other concerning symptoms in the interval. The total time spent in the appointment was 60 minutes. Disclaimer: This note was dictated with voice recognition software. Similar sounding words can inadvertently be transcribed and may be missed upon review. Eilleen Kempf, MD

## 2021-05-15 ENCOUNTER — Inpatient Hospital Stay: Payer: BC Managed Care – PPO | Attending: Physician Assistant

## 2021-05-15 ENCOUNTER — Other Ambulatory Visit: Payer: Self-pay

## 2021-05-15 ENCOUNTER — Inpatient Hospital Stay: Payer: BC Managed Care – PPO | Admitting: Physician Assistant

## 2021-05-15 VITALS — BP 148/103 | HR 75 | Resp 18 | Wt 172.4 lb

## 2021-05-15 DIAGNOSIS — I10 Essential (primary) hypertension: Secondary | ICD-10-CM

## 2021-05-15 DIAGNOSIS — C3411 Malignant neoplasm of upper lobe, right bronchus or lung: Secondary | ICD-10-CM

## 2021-05-15 DIAGNOSIS — Z79899 Other long term (current) drug therapy: Secondary | ICD-10-CM | POA: Diagnosis not present

## 2021-05-15 DIAGNOSIS — F1290 Cannabis use, unspecified, uncomplicated: Secondary | ICD-10-CM

## 2021-05-15 DIAGNOSIS — R911 Solitary pulmonary nodule: Secondary | ICD-10-CM

## 2021-05-15 DIAGNOSIS — R634 Abnormal weight loss: Secondary | ICD-10-CM | POA: Diagnosis not present

## 2021-05-15 DIAGNOSIS — Z902 Acquired absence of lung [part of]: Secondary | ICD-10-CM | POA: Diagnosis not present

## 2021-05-15 DIAGNOSIS — Z87891 Personal history of nicotine dependence: Secondary | ICD-10-CM

## 2021-05-15 DIAGNOSIS — Z800 Family history of malignant neoplasm of digestive organs: Secondary | ICD-10-CM

## 2021-05-15 DIAGNOSIS — Z807 Family history of other malignant neoplasms of lymphoid, hematopoietic and related tissues: Secondary | ICD-10-CM

## 2021-05-15 DIAGNOSIS — C3490 Malignant neoplasm of unspecified part of unspecified bronchus or lung: Secondary | ICD-10-CM | POA: Insufficient documentation

## 2021-05-15 DIAGNOSIS — C3491 Malignant neoplasm of unspecified part of right bronchus or lung: Secondary | ICD-10-CM

## 2021-05-15 DIAGNOSIS — C3431 Malignant neoplasm of lower lobe, right bronchus or lung: Secondary | ICD-10-CM | POA: Diagnosis present

## 2021-05-15 DIAGNOSIS — Z7289 Other problems related to lifestyle: Secondary | ICD-10-CM

## 2021-05-15 LAB — CBC WITH DIFFERENTIAL (CANCER CENTER ONLY)
Abs Immature Granulocytes: 0.02 10*3/uL (ref 0.00–0.07)
Basophils Absolute: 0 10*3/uL (ref 0.0–0.1)
Basophils Relative: 0 %
Eosinophils Absolute: 0.1 10*3/uL (ref 0.0–0.5)
Eosinophils Relative: 1 %
HCT: 38.6 % (ref 36.0–46.0)
Hemoglobin: 12.6 g/dL (ref 12.0–15.0)
Immature Granulocytes: 0 %
Lymphocytes Relative: 30 %
Lymphs Abs: 3 10*3/uL (ref 0.7–4.0)
MCH: 35.6 pg — ABNORMAL HIGH (ref 26.0–34.0)
MCHC: 32.6 g/dL (ref 30.0–36.0)
MCV: 109 fL — ABNORMAL HIGH (ref 80.0–100.0)
Monocytes Absolute: 0.8 10*3/uL (ref 0.1–1.0)
Monocytes Relative: 8 %
Neutro Abs: 6.2 10*3/uL (ref 1.7–7.7)
Neutrophils Relative %: 61 %
Platelet Count: 478 10*3/uL — ABNORMAL HIGH (ref 150–400)
RBC: 3.54 MIL/uL — ABNORMAL LOW (ref 3.87–5.11)
RDW: 14.3 % (ref 11.5–15.5)
WBC Count: 10.1 10*3/uL (ref 4.0–10.5)
nRBC: 0 % (ref 0.0–0.2)

## 2021-05-15 LAB — CMP (CANCER CENTER ONLY)
ALT: 12 U/L (ref 0–44)
AST: 15 U/L (ref 15–41)
Albumin: 4 g/dL (ref 3.5–5.0)
Alkaline Phosphatase: 95 U/L (ref 38–126)
Anion gap: 5 (ref 5–15)
BUN: 11 mg/dL (ref 6–20)
CO2: 31 mmol/L (ref 22–32)
Calcium: 9.3 mg/dL (ref 8.9–10.3)
Chloride: 106 mmol/L (ref 98–111)
Creatinine: 0.81 mg/dL (ref 0.44–1.00)
GFR, Estimated: >60 mL/min (ref >60)
Glucose, Bld: 109 mg/dL — ABNORMAL HIGH (ref 70–99)
Potassium: 4.2 mmol/L (ref 3.5–5.1)
Sodium: 142 mmol/L (ref 135–145)
Total Bilirubin: 0.4 mg/dL (ref 0.3–1.2)
Total Protein: 7.4 g/dL (ref 6.5–8.1)

## 2021-05-26 ENCOUNTER — Other Ambulatory Visit: Payer: Self-pay

## 2021-05-26 ENCOUNTER — Ambulatory Visit (INDEPENDENT_AMBULATORY_CARE_PROVIDER_SITE_OTHER): Payer: Self-pay | Admitting: Thoracic Surgery (Cardiothoracic Vascular Surgery)

## 2021-05-26 DIAGNOSIS — Z09 Encounter for follow-up examination after completed treatment for conditions other than malignant neoplasm: Secondary | ICD-10-CM

## 2021-05-26 MED ORDER — TRAMADOL HCL 50 MG PO TABS: 50.0000 mg | ORAL_TABLET | Freq: Four times a day (QID) | ORAL | 0 refills | Status: DC | PRN

## 2021-05-26 NOTE — Progress Notes (Signed)
?   ?  [address] ?      [place] [address] ?            [phone]      ? ?Patient: Home ?Provider: Office ?Consent for Telemedicine visit obtained. ? ?Today?s visit was completed via a real-time telehealth (see specific modality noted below). The patient/authorized person provided oral consent at the time of the visit to engage in a telemedicine encounter with the present provider at [facility]. The patient/authorized person was informed of the potential benefits, limitations, and risks of telemedicine. The patient/authorized person expressed understanding that the laws that protect confidentiality also apply to telemedicine. The patient/authorized person acknowledged understanding that telemedicine does not provide emergency services and that he or she would need to call 911 or proceed to the nearest hospital for help if such a need arose. ? ? Total time spent in the clinical discussion 10 minutes. ? Telehealth Modality: Phone visit (audio only) ? ?I had a telephone visit with Brittany Macias.  She is status post right lower lobectomy for stage I adenocarcinoma.  She continues to have some incisional pain which has not been helped with Lyrica.  She is performing warm compresses and massages, but is only taking her medication as needed.  I recommended that she stagger her ibuprofen and Tylenol every 3 hours, and have given her a new prescription for tramadol.  I will touch base with her in a few weeks to see if this regimen is working. ?

## 2021-05-29 ENCOUNTER — Telehealth: Payer: Self-pay

## 2021-05-29 MED ORDER — TRAMADOL HCL 50 MG PO TABS: 50.0000 mg | ORAL_TABLET | Freq: Four times a day (QID) | ORAL | 0 refills | Status: AC | PRN

## 2021-05-29 NOTE — Telephone Encounter (Signed)
Patient contacted the office to state that she had a telehealth appointment with Dr. Kipp Brood Friday, 05/26/21 and per Dr. Kipp Brood she was to get a refill of Tramadol. Patient states that the refill was never called into her pharmacy [facility] in [place]. Spoke with Ulice Dash at [facility] and last refill was last month 04/2021. Called in Rx for Tramadol per Dr. Abran Duke last note. Patient made aware of refill and acknowledged receipt.  ?

## 2021-06-30 ENCOUNTER — Telehealth: Payer: BC Managed Care – PPO | Admitting: Thoracic Surgery (Cardiothoracic Vascular Surgery)

## 2021-07-14 ENCOUNTER — Ambulatory Visit (INDEPENDENT_AMBULATORY_CARE_PROVIDER_SITE_OTHER): Payer: Medicaid - Out of State | Admitting: Thoracic Surgery (Cardiothoracic Vascular Surgery)

## 2021-07-14 DIAGNOSIS — Z902 Acquired absence of lung [part of]: Secondary | ICD-10-CM

## 2021-07-14 MED ORDER — PREGABALIN 25 MG PO CAPS: 25.0000 mg | ORAL_CAPSULE | Freq: Two times a day (BID) | ORAL | 0 refills | Status: DC

## 2021-07-14 NOTE — Progress Notes (Signed)
?   ?  [address] ?      [place] [address] ?            [phone]      ? ?Patient: Home ?Provider: Office ?Consent for Telemedicine visit obtained. ? ?Today?s visit was completed via a real-time telehealth (see specific modality noted below). The patient/authorized person provided oral consent at the time of the visit to engage in a telemedicine encounter with the present provider at [facility]. The patient/authorized person was informed of the potential benefits, limitations, and risks of telemedicine. The patient/authorized person expressed understanding that the laws that protect confidentiality also apply to telemedicine. The patient/authorized person acknowledged understanding that telemedicine does not provide emergency services and that he or she would need to call 911 or proceed to the nearest hospital for help if such a need arose. ? ? Total time spent in the clinical discussion 10 minutes. ? Telehealth Modality: Phone visit (audio only) ? ?I had a telephone visit with Brittany Macias.  She is status post robotic assisted right lower lobectomy for non-small cell lung cancer.  She has had persistent paresthesias along her incision line.  This is improved continues to have some nagging pain.  Outside of that she is doing well.  She denies any shortness of breath.  I have given her another dose of Lyrica and have instructed her to obtain further refills from her primary care physician.  She will follow-up with [place] as needed. ? ?

## 2021-10-12 ENCOUNTER — Ambulatory Visit (HOSPITAL_COMMUNITY)
Admission: RE | Admit: 2021-10-12 | Discharge: 2021-10-12 | Disposition: A | Discharge status: Home / Self Care | Payer: Medicaid - Out of State | Source: Ambulatory Visit | Attending: Physician Assistant | Admitting: Physician Assistant

## 2021-10-12 DIAGNOSIS — C3491 Malignant neoplasm of unspecified part of right bronchus or lung: Secondary | ICD-10-CM | POA: Insufficient documentation

## 2021-10-12 MED ORDER — IOHEXOL 300 MG/ML  SOLN
75.0000 mL | Freq: Once | INTRAMUSCULAR | Status: AC | PRN
  Administered 2021-10-12: 75 mL via INTRAVENOUS

## 2021-10-13 NOTE — Progress Notes (Unsigned)
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  Diagnosed in December 2022.   PRIOR THERAPY: She is status post right lower lobectomy under the care of Dr. Kipp Brood which was performed on 03/22/2021.  CURRENT THERAPY: Observation   INTERVAL HISTORY: Brittany Macias 51 y.o. female returns to the clinic today for follow-up visit.  Patient was first seen in the clinic on 05/15/2021.  The patient has a history of stage Ia adenocarcinoma which Dr. Kipp Brood performed a right lower lobectomy in December 2022.  The patient is presently on observation and feeling well.  Today she denies any new concerning complaints.  Postsurgical pain?  She denies any fever, chills, night sweats, or unexplained weight loss.  Denies any chest pain, shortness of breath, cough, or hemoptysis.  Denies any nausea, vomiting, diarrhea, or constipation.  Denies any headache or visual changes.  The patient recently had a restaging CT scan performed.  The patient quit smoking on the day of her surgery and has continued to quit.  She is here today for evaluation to review her scan results.  MEDICAL HISTORY: Past Medical History:  Diagnosis Date   Anemia    Anxiety    Cancer ([place])    Depression    Former smoker    Pneumonia    PONV (postoperative nausea and vomiting)     ALLERGIES:  is allergic to erythromycin and sulfa antibiotics.  MEDICATIONS:  Current Outpatient Medications  Medication Sig Dispense Refill   acetaminophen (TYLENOL) 500 MG tablet Take 2 tablets (1,000 mg total) by mouth every 6 (six) hours as needed. 30 tablet 0   albuterol (VENTOLIN HFA) 108 (90 Base) MCG/ACT inhaler Inhale 2 puffs into the lungs every 6 (six) hours as needed for wheezing or shortness of breath. 8 g 6   ALPRAZolam (XANAX) 0.25 MG tablet Take 0.25 mg by mouth 2 (two) times daily as needed for anxiety.      cholecalciferol (VITAMIN D3) 25 MCG (1000 UNIT) tablet Take 1,000 Units by mouth daily.     ELDERBERRY PO Take 1 tablet by mouth daily.     FLUoxetine (PROZAC) 10 MG tablet Take 10 mg by mouth at bedtime.     levothyroxine (SYNTHROID) 100 MCG tablet Take 100 mcg by mouth daily before breakfast.     ondansetron (ZOFRAN) 4 MG tablet Take 1 tablet (4 mg total) by mouth 2 (two) times daily as needed for nausea or vomiting. 10 tablet 0   pregabalin (LYRICA) 25 MG capsule Take 1 capsule (25 mg total) by mouth 2 (two) times daily. 60 capsule 0   pregabalin (LYRICA) 25 MG capsule Take 1 capsule (25 mg total) by mouth 2 (two) times daily. 60 capsule 0   tetrahydrozoline-zinc (VISINE-AC) 0.05-0.25 % ophthalmic solution Place 2 drops into both eyes 3 (three) times daily as needed (dry eyes).     Tiotropium Bromide Monohydrate (SPIRIVA RESPIMAT) 1.25 MCG/ACT AERS Inhale 2 puffs into the lungs daily. 4 g 0   traMADol (ULTRAM) 50 MG tablet Take 1 tablet (50 mg total) by mouth every 6 (six) hours as needed. 30 tablet 0   vitamin C (ASCORBIC ACID) 500 MG tablet Take 500 mg by mouth daily.     No current facility-administered medications for this visit.    SURGICAL HISTORY:  Past Surgical History:  Procedure Laterality Date   BRONCHIAL BIOPSY  03/22/2021  Procedure: BRONCHIAL BIOPSIES;  Surgeon: Garner Nash, DO;  Location: [facility];  Service: Pulmonary;;   BRONCHIAL BRUSHINGS  03/22/2021   Procedure: BRONCHIAL BRUSHINGS;  Surgeon: Garner Nash, DO;  Location: [facility];  Service: Pulmonary;;   BRONCHIAL NEEDLE ASPIRATION BIOPSY  03/22/2021   Procedure: BRONCHIAL NEEDLE ASPIRATION BIOPSIES;  Surgeon: Garner Nash, DO;  Location: [facility];  Service: Pulmonary;;   CAROTID ENDARTERECTOMY Right 2007   FIDUCIAL MARKER PLACEMENT  03/22/2021   Procedure: FIDUCIAL DYE MARKING;  Surgeon: Garner Nash, DO;  Location: [facility];  Service: Pulmonary;;   INTERCOSTAL NERVE BLOCK Right  03/22/2021   Procedure: INTERCOSTAL NERVE BLOCK;  Surgeon: Lajuana Matte, MD;  Location: [facility];  Service: Thoracic;  Laterality: Right;   PARTIAL HYSTERECTOMY     THYROIDECTOMY     VIDEO BRONCHOSCOPY WITH RADIAL ENDOBRONCHIAL ULTRASOUND  03/22/2021   Procedure: RADIAL ENDOBRONCHIAL ULTRASOUND;  Surgeon: Garner Nash, DO;  Location: [facility];  Service: Pulmonary;;    REVIEW OF SYSTEMS:   Review of Systems  Constitutional: Negative for appetite change, chills, fatigue, fever and unexpected weight change.  HENT:   Negative for mouth sores, nosebleeds, sore throat and trouble swallowing.   Eyes: Negative for eye problems and icterus.  Respiratory: Negative for cough, hemoptysis, shortness of breath and wheezing.   Cardiovascular: Negative for chest pain and leg swelling.  Gastrointestinal: Negative for abdominal pain, constipation, diarrhea, nausea and vomiting.  Genitourinary: Negative for bladder incontinence, difficulty urinating, dysuria, frequency and hematuria.   Musculoskeletal: Negative for back pain, gait problem, neck pain and neck stiffness.  Skin: Negative for itching and rash.  Neurological: Negative for dizziness, extremity weakness, gait problem, headaches, light-headedness and seizures.  Hematological: Negative for adenopathy. Does not bruise/bleed easily.  Psychiatric/Behavioral: Negative for confusion, depression and sleep disturbance. The patient is not nervous/anxious.     PHYSICAL EXAMINATION:  There were no vitals taken for this visit.  ECOG PERFORMANCE STATUS: {CHL ONC ECOG [id number]  Physical Exam  Constitutional: Oriented to person, place, and time and well-developed, well-nourished, and in no distress. No distress.  HENT:  Head: Normocephalic and atraumatic.  Mouth/Throat: Oropharynx is clear and moist. No oropharyngeal exudate.  Eyes: Conjunctivae are normal. Right eye exhibits no discharge. Left eye exhibits no discharge. No scleral  icterus.  Neck: Normal range of motion. Neck supple.  Cardiovascular: Normal rate, regular rhythm, normal heart sounds and intact distal pulses.   Pulmonary/Chest: Effort normal and breath sounds normal. No respiratory distress. No wheezes. No rales.  Abdominal: Soft. Bowel sounds are normal. Exhibits no distension and no mass. There is no tenderness.  Musculoskeletal: Normal range of motion. Exhibits no edema.  Lymphadenopathy:    No cervical adenopathy.  Neurological: Alert and oriented to person, place, and time. Exhibits normal muscle tone. Gait normal. Coordination normal.  Skin: Skin is warm and dry. No rash noted. Not diaphoretic. No erythema. No pallor.  Psychiatric: Mood, memory and judgment normal.  Vitals reviewed.  LABORATORY DATA: Lab Results  Component Value Date   WBC 10.1 05/15/2021   HGB 12.6 05/15/2021   HCT 38.6 05/15/2021   MCV 109.0 (H) 05/15/2021   PLT 478 (H) 05/15/2021      Chemistry      Component Value Date/Time   NA 142 05/15/2021 0847   K 4.2 05/15/2021 0847   CL 106 05/15/2021 0847   CO2 31 05/15/2021 0847   BUN 11 05/15/2021 0847   CREATININE 0.81 05/15/2021 0847  Component Value Date/Time   CALCIUM 9.3 05/15/2021 0847   ALKPHOS 95 05/15/2021 0847   AST 15 05/15/2021 0847   ALT 12 05/15/2021 0847   BILITOT 0.4 05/15/2021 0847       RADIOGRAPHIC STUDIES:  CT Chest W Contrast  Result Date: 10/13/2021 CLINICAL DATA:  Primary Cancer Type: Lung Imaging Indication: Assess response to therapy Interval therapy since last imaging?  Yes, resection Initial Cancer Diagnosis Date: 03/22/2021; Established by: Biopsy-proven Detailed Pathology: Stage 1a non-small cell lung carcinoma, poorly differentiated adenocarcinoma. Primary Tumor location: Right lower lobe. Surgeries: Right lower lobectomy 03/22/2021. Chemotherapy: No Immunotherapy? No Radiation therapy? No * Tracking Code: BO * EXAM: CT CHEST WITH CONTRAST TECHNIQUE: Multidetector CT imaging of  the chest was performed during intravenous contrast administration. RADIATION DOSE REDUCTION: This exam was performed according to the departmental dose-optimization program which includes automated exposure control, adjustment of the mA and/or kV according to patient size and/or use of iterative reconstruction technique. CONTRAST:  72mL OMNIPAQUE IOHEXOL 300 MG/ML  SOLN COMPARISON:  Most recent CT chest 03/17/2021.  02/23/2021 PET-CT. FINDINGS: Cardiovascular: Coronary, aortic arch, and branch vessel atherosclerotic vascular disease. Mediastinum/Nodes: Unremarkable Lungs/Pleura: Paraseptal emphysema. Right lower lobectomy. There is some narrowing of the right middle lobe bronchus in the vicinity of the staple line from the lobectomy on image 90 series 4. Upper Abdomen: Hypodense lesion with peripheral nodular discontinuous enhancement in segment 2 of the liver measuring 2.4 by 1.8 cm on image 127 of series 2, retrospectively present on prior exams and not hypermetabolic on the PET-CT of 02/23/2021. The appearance is likely to represent a benign hepatic hemangioma and is unlikely to be due to malignancy. Abdominal aortic atherosclerotic calcification noted. Musculoskeletal: Unremarkable IMPRESSION: 1. Interval right lower lobectomy, no findings of active tumor. 2. A mass in segment 2 of the liver is hypodense with peripheral nodular margins, retrospectively similar in size on 01/24/2021 PET-CT where this lesion was not hypermetabolic. Appearance likely to represent a benign hepatic hemangioma based on the lack of hypermetabolic activity and pattern of peripheral nodular enhancement. This could be definitively assessed with hepatic protocol MRI with and without contrast; given the high likelihood of a benign etiology, it could also be simply be surveilled in the context of the patient's follow up oncology imaging. Electronically Signed   By: Van Clines M.D.   On: 10/13/2021 11:39     ASSESSMENT/PLAN:   This is a very pleasant 51 year old African-American female referred to the clinic for stage Ia (T1c , N0 , M0 ) adenocarcinoma.  She is status post right lower lobectomy under the care of Dr. Kipp Brood which was performed on 03/22/2021.  The patient is currently on observation and feeling well.  The patient was seen with Dr. Julien Nordmann today.  Dr. Julien Nordmann personally and independently reviewed the scan discussed results with the patient today.  The scan did not show any evidence of disease progression.  Dr. Julien Nordmann recommends that the patient continue on observation with a repeat CT scan of the chest in 6 months.  We will see her back for follow-up visit at that time for evaluation to review her scan results   Smoking cessation?  Scan at [facility]?  The patient was advised to call immediately if she has any concerning symptoms in the interval. The patient voices understanding of current disease status and treatment options and is in agreement with the current care plan. All questions were answered. The patient knows to call the clinic with any problems, questions or  concerns. We can certainly see the patient much sooner if necessary     No orders of the defined types were placed in this encounter.    I spent {CHL ONC TIME VISIT - LHTDS:[id number]} counseling the patient face to face. The total time spent in the appointment was {CHL ONC TIME VISIT - IOMBT:[id number]}.  Seaborn Nakama L Mark Benecke, PA-C 10/13/21

## 2021-10-18 ENCOUNTER — Inpatient Hospital Stay: Payer: Medicaid - Out of State | Attending: Physician Assistant

## 2021-10-18 ENCOUNTER — Ambulatory Visit: Payer: Self-pay | Admitting: Physician Assistant

## 2021-10-18 ENCOUNTER — Other Ambulatory Visit: Payer: Self-pay

## 2021-10-18 ENCOUNTER — Encounter: Payer: Self-pay | Admitting: Physician Assistant

## 2021-10-18 ENCOUNTER — Inpatient Hospital Stay (HOSPITAL_BASED_OUTPATIENT_CLINIC_OR_DEPARTMENT_OTHER): Payer: Medicaid - Out of State | Admitting: Physician Assistant

## 2021-10-18 VITALS — BP 147/97 | HR 88 | Temp 98.4°F | Resp 17 | Wt 163.6 lb

## 2021-10-18 DIAGNOSIS — C3491 Malignant neoplasm of unspecified part of right bronchus or lung: Secondary | ICD-10-CM

## 2021-10-18 DIAGNOSIS — R630 Anorexia: Secondary | ICD-10-CM | POA: Insufficient documentation

## 2021-10-18 DIAGNOSIS — K5900 Constipation, unspecified: Secondary | ICD-10-CM | POA: Insufficient documentation

## 2021-10-18 DIAGNOSIS — C3431 Malignant neoplasm of lower lobe, right bronchus or lung: Secondary | ICD-10-CM | POA: Insufficient documentation

## 2021-10-18 DIAGNOSIS — Z902 Acquired absence of lung [part of]: Secondary | ICD-10-CM | POA: Diagnosis not present

## 2021-10-18 DIAGNOSIS — Z87891 Personal history of nicotine dependence: Secondary | ICD-10-CM | POA: Insufficient documentation

## 2021-10-18 DIAGNOSIS — R160 Hepatomegaly, not elsewhere classified: Secondary | ICD-10-CM | POA: Insufficient documentation

## 2021-10-18 LAB — CBC WITH DIFFERENTIAL (CANCER CENTER ONLY)
Abs Immature Granulocytes: 0.03 10*3/uL (ref 0.00–0.07)
Basophils Absolute: 0 10*3/uL (ref 0.0–0.1)
Basophils Relative: 0 %
Eosinophils Absolute: 0.1 10*3/uL (ref 0.0–0.5)
Eosinophils Relative: 1 %
HCT: 40.2 % (ref 36.0–46.0)
Hemoglobin: 13.6 g/dL (ref 12.0–15.0)
Immature Granulocytes: 0 %
Lymphocytes Relative: 35 %
Lymphs Abs: 3.6 10*3/uL (ref 0.7–4.0)
MCH: 36.8 pg — ABNORMAL HIGH (ref 26.0–34.0)
MCHC: 33.8 g/dL (ref 30.0–36.0)
MCV: 108.6 fL — ABNORMAL HIGH (ref 80.0–100.0)
Monocytes Absolute: 0.7 10*3/uL (ref 0.1–1.0)
Monocytes Relative: 7 %
Neutro Abs: 6 10*3/uL (ref 1.7–7.7)
Neutrophils Relative %: 57 %
Platelet Count: 433 10*3/uL — ABNORMAL HIGH (ref 150–400)
RBC: 3.7 MIL/uL — ABNORMAL LOW (ref 3.87–5.11)
RDW: 15 % (ref 11.5–15.5)
WBC Count: 10.4 10*3/uL (ref 4.0–10.5)
nRBC: 0 % (ref 0.0–0.2)

## 2021-10-18 LAB — CMP (CANCER CENTER ONLY)
ALT: 11 U/L (ref 0–44)
AST: 15 U/L (ref 15–41)
Albumin: 4.2 g/dL (ref 3.5–5.0)
Alkaline Phosphatase: 102 U/L (ref 38–126)
Anion gap: 3 — ABNORMAL LOW (ref 5–15)
BUN: 9 mg/dL (ref 6–20)
CO2: 30 mmol/L (ref 22–32)
Calcium: 9.3 mg/dL (ref 8.9–10.3)
Chloride: 108 mmol/L (ref 98–111)
Creatinine: 0.77 mg/dL (ref 0.44–1.00)
GFR, Estimated: >60 mL/min (ref >60)
Glucose, Bld: 94 mg/dL (ref 70–99)
Potassium: 4.2 mmol/L (ref 3.5–5.1)
Sodium: 141 mmol/L (ref 135–145)
Total Bilirubin: 0.4 mg/dL (ref 0.3–1.2)
Total Protein: 7.6 g/dL (ref 6.5–8.1)

## 2021-10-27 ENCOUNTER — Telehealth: Payer: Self-pay

## 2021-10-27 NOTE — Telephone Encounter (Signed)
Referral have been re-sent to: [facility] - Dr. Jodelle Green   [facility]: [phone] FX: [phone]   Referral Insurance/Demographics Progress note Labs CT/PET scans Surgical path  Confirmation was received.

## 2021-11-15 ENCOUNTER — Ambulatory Visit: Payer: BC Managed Care – PPO | Admitting: Physician Assistant

## 2021-11-15 ENCOUNTER — Other Ambulatory Visit: Payer: BC Managed Care – PPO

## 2021-12-15 NOTE — Telephone Encounter (Signed)
Referral have been re-sent to: [facility] - Dr. Jodelle Green   [facility]: [phone] FX: [phone]   Referral Insurance/Demographics Progress note Labs CT/PET scans Surgical path   Confirmation was received.

## 2021-12-15 NOTE — Telephone Encounter (Signed)
Pt called stating [facility] advised her a referral was never received. She provides another fax of [phone].  I have also LVM for [place], new pt coordinator for [facility] requesting she confirm receipt.

## 2022-02-01 ENCOUNTER — Encounter (HOSPITAL_COMMUNITY): Payer: Self-pay

## 2023-03-04 IMAGING — DX DG CHEST 1V PORT
1 series · 1 of 1 positions shown · non-contrast
Comparison: Chest x-ray 03/22/2021

CLINICAL DATA: Chest tube

EXAM:
PORTABLE CHEST 1 VIEW

[chest ap]
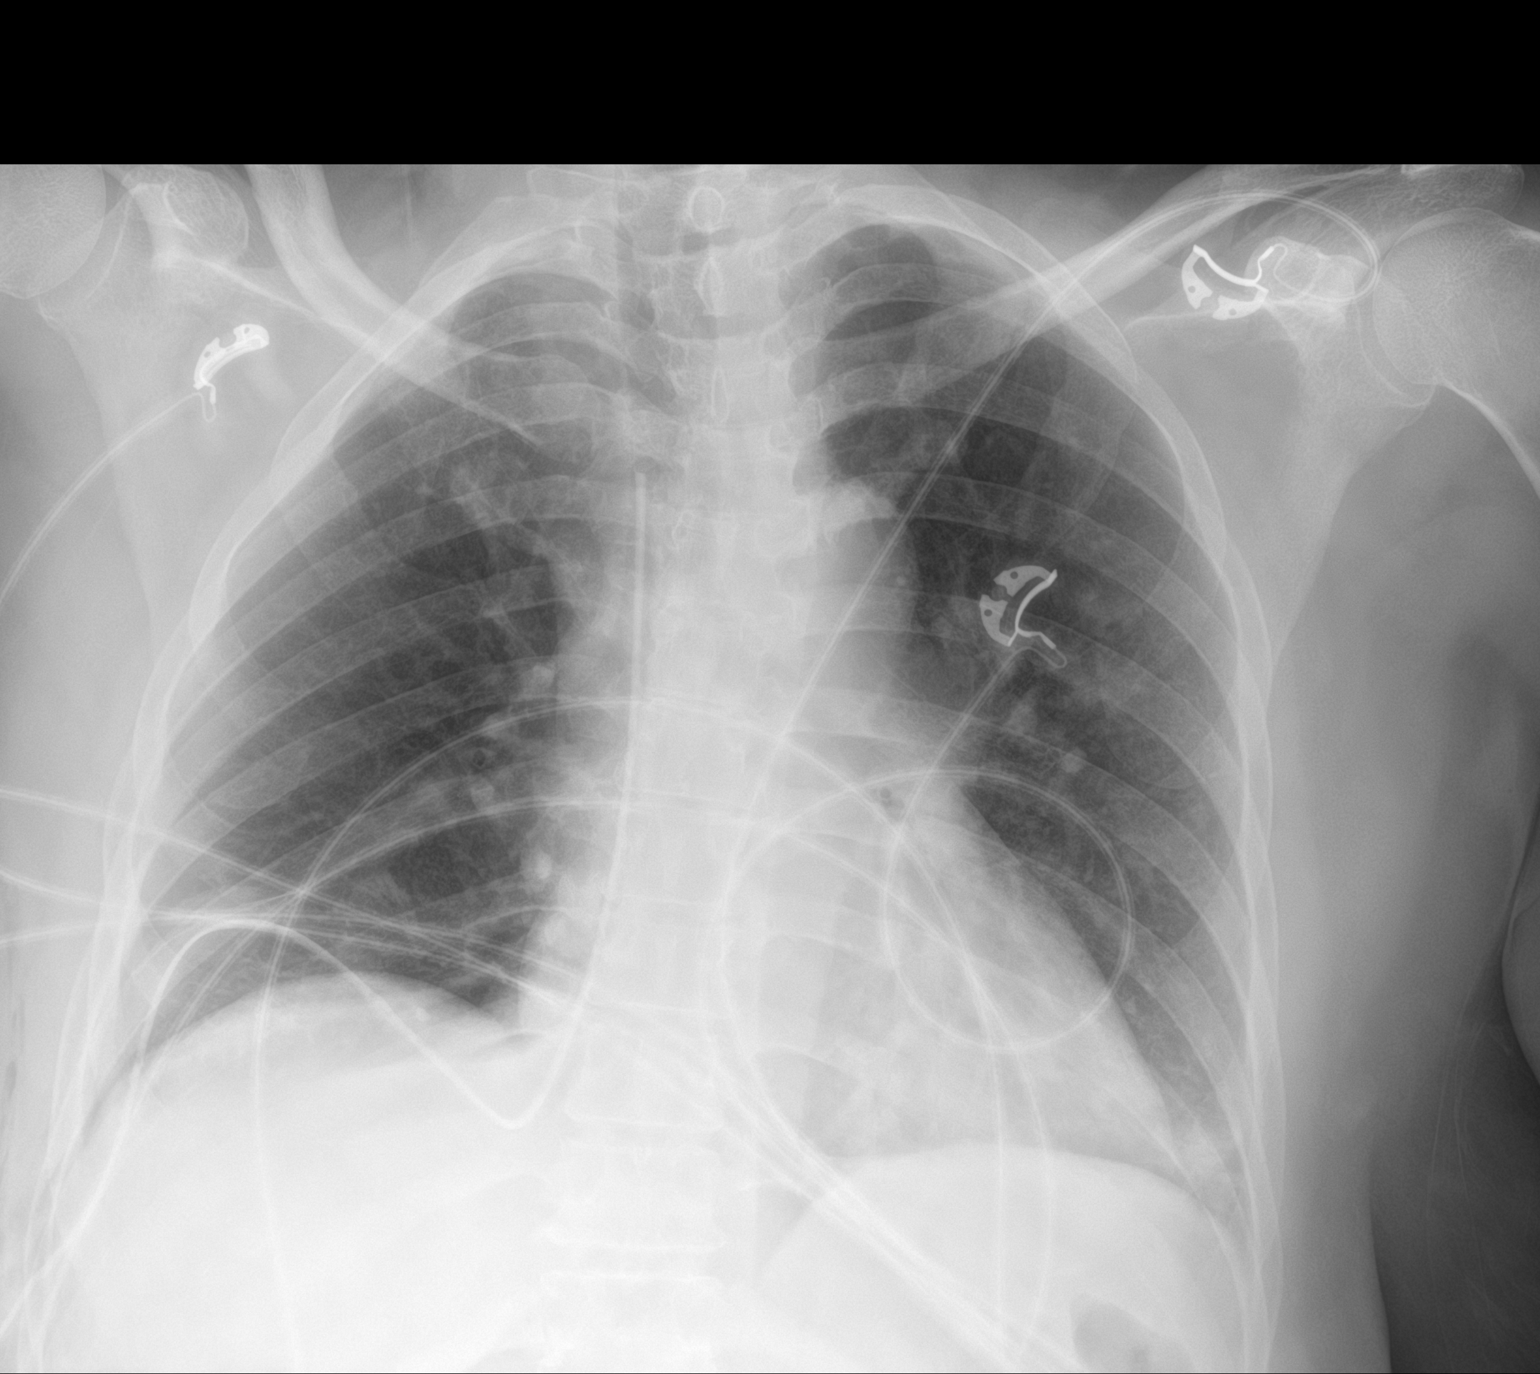

[1 of 1 positions shown; findings below may reference images not displayed]

FINDINGS: Right-sided chest tubes in stable position with the tip in the
medial right upper lung zone. Cardiomediastinal silhouette is
unchanged. Mild opacities at the left lung base likely represent
subsegmental atelectasis. No pleural effusion or pneumothorax
visualized.
IMPRESSION: 1. Right-sided chest tube is stable.
2. Left basilar opacities likely represent subsegmental atelectasis.

## 2023-04-09 IMAGING — CR DG CHEST 2V
2 series · 2 of 2 positions shown · non-contrast
Comparison: Chest radiograph dated March 24, 2021

CLINICAL DATA: Status post resection of lung nodule in the right
lung base, 6 weeks ago.

EXAM:
CHEST - 2 VIEW

[w chest pa]
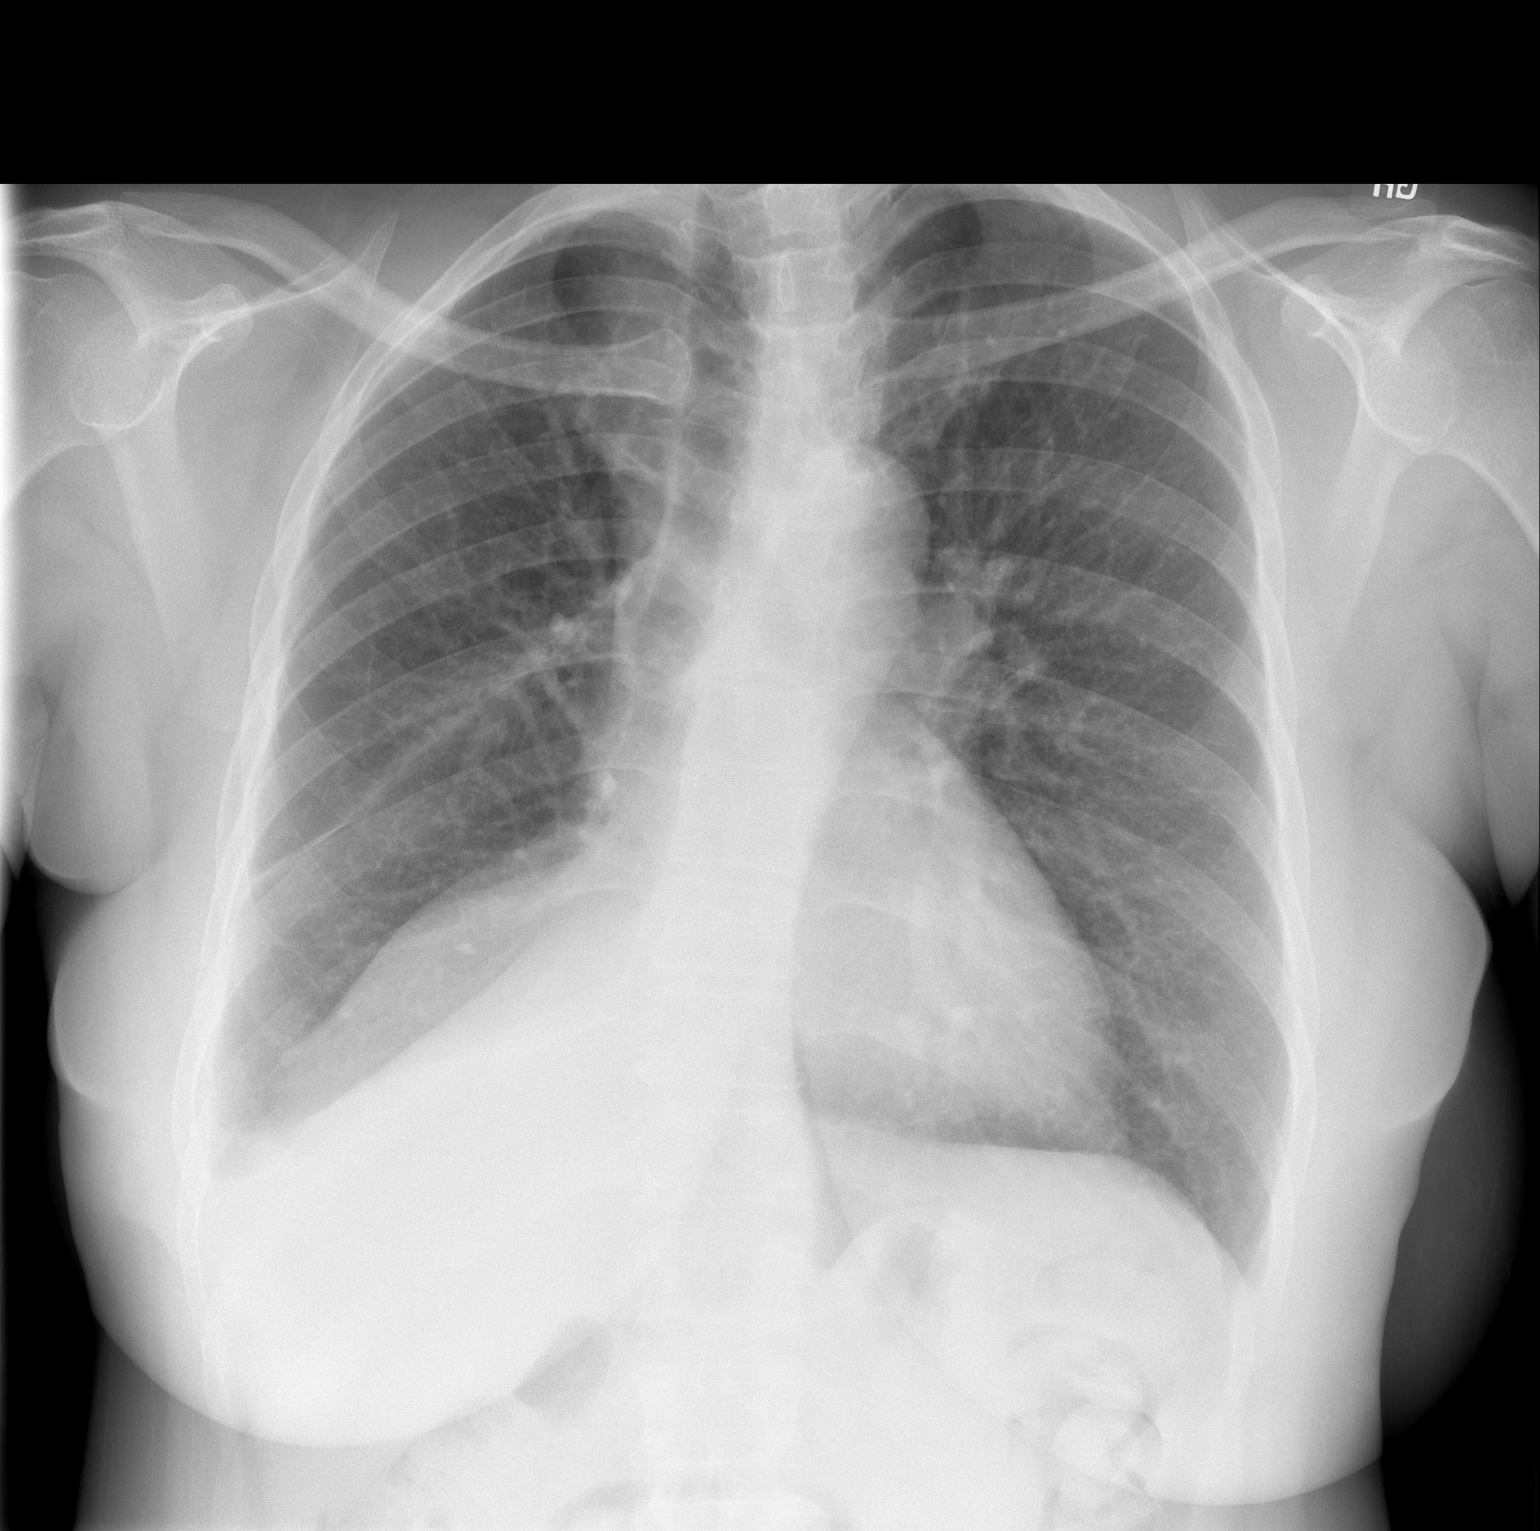

[w chest lat]
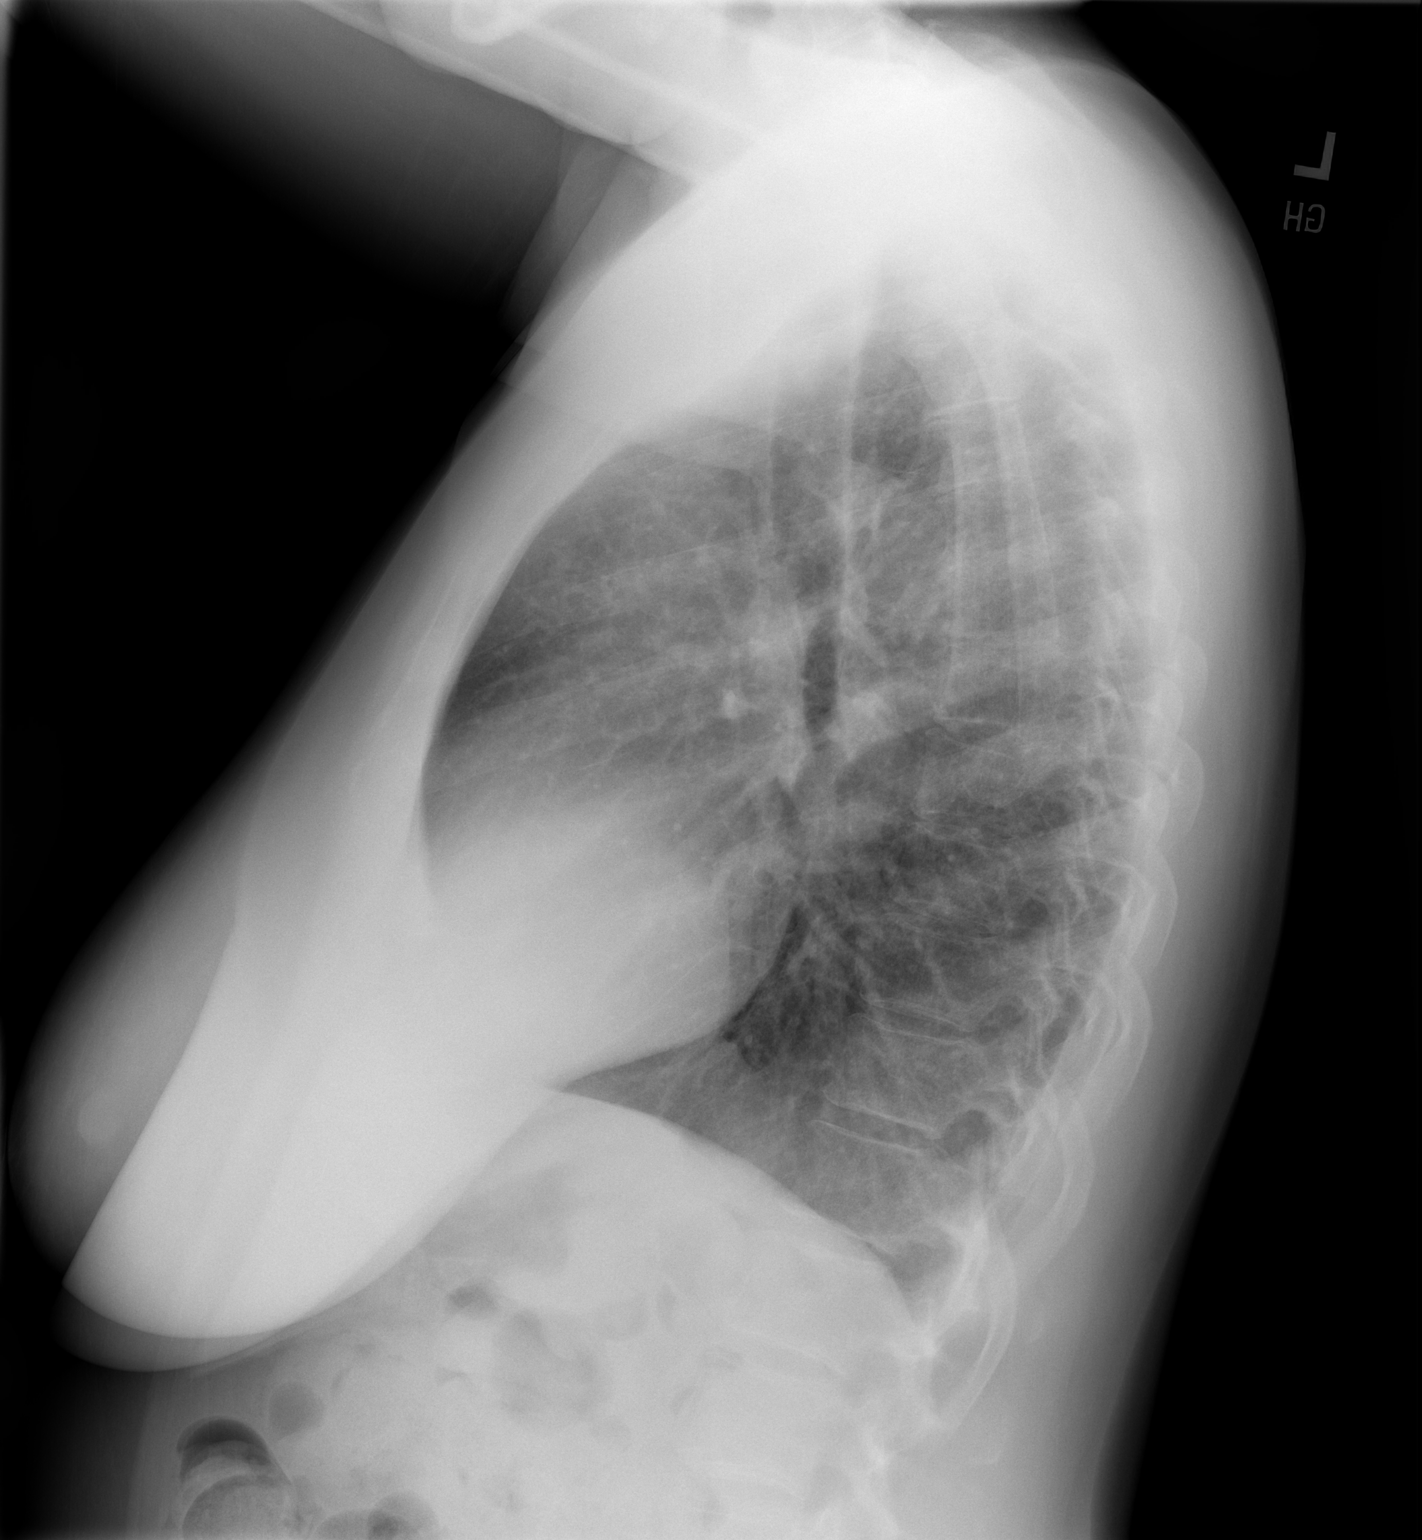

[2 of 2 positions shown; findings below may reference images not displayed]

FINDINGS: The heart size and mediastinal contours are within normal limits.
Linear atelectasis/postsurgical changes in the right lower lobe with
mild elevation of the right hemidiaphragm. Lungs are otherwise
clear. No pleural effusion or pneumothorax. Interval removal of the
right-sided chest tube. The visualized skeletal structures are
unremarkable.
IMPRESSION: No active cardiopulmonary disease.
